# Patient Record
Sex: Male | Born: 1965 | Race: Black or African American | Hispanic: No | Marital: Married | Smoking: Never smoker
Health system: Southern US, Community
[De-identification: ages and names within clinical notes are randomized; demographics above are authoritative.]

## PROBLEM LIST (undated history)

## (undated) DIAGNOSIS — D6859 Other primary thrombophilia: Secondary | ICD-10-CM

## (undated) DIAGNOSIS — I10 Essential (primary) hypertension: Secondary | ICD-10-CM

## (undated) DIAGNOSIS — I2699 Other pulmonary embolism without acute cor pulmonale: Secondary | ICD-10-CM

## (undated) DIAGNOSIS — R760 Raised antibody titer: Secondary | ICD-10-CM

## (undated) HISTORY — DX: Raised antibody titer: R76.0

## (undated) HISTORY — DX: Essential (primary) hypertension: I10

## (undated) HISTORY — DX: Other primary thrombophilia: D68.59

## (undated) HISTORY — DX: Other pulmonary embolism without acute cor pulmonale: I26.99

---

## 2002-03-17 ENCOUNTER — Encounter: Payer: Self-pay | Admitting: Family Medicine

## 2002-03-17 ENCOUNTER — Ambulatory Visit (HOSPITAL_COMMUNITY): Admission: RE | Admit: 2002-03-17 | Discharge: 2002-03-17 | Payer: Self-pay | Admitting: Family Medicine

## 2002-03-18 ENCOUNTER — Inpatient Hospital Stay (HOSPITAL_COMMUNITY): Admission: EM | Admit: 2002-03-18 | Discharge: 2002-03-19 | Payer: Self-pay

## 2002-03-26 ENCOUNTER — Encounter: Admission: RE | Admit: 2002-03-26 | Discharge: 2002-03-26 | Payer: Self-pay | Admitting: Family Medicine

## 2004-07-26 ENCOUNTER — Ambulatory Visit: Payer: Self-pay | Admitting: Hematology & Oncology

## 2005-05-12 ENCOUNTER — Inpatient Hospital Stay (HOSPITAL_COMMUNITY): Admission: EM | Admit: 2005-05-12 | Discharge: 2005-05-16 | Payer: Self-pay | Admitting: Emergency Medicine

## 2005-05-12 ENCOUNTER — Ambulatory Visit: Payer: Self-pay | Admitting: Internal Medicine

## 2005-05-15 LAB — CONVERTED CEMR LAB
Cholesterol: 130 mg/dL
HDL: 48 mg/dL

## 2005-05-17 ENCOUNTER — Ambulatory Visit: Payer: Self-pay | Admitting: Internal Medicine

## 2005-05-21 ENCOUNTER — Ambulatory Visit: Payer: Self-pay | Admitting: Hospitalist

## 2005-05-28 ENCOUNTER — Ambulatory Visit: Payer: Self-pay | Admitting: Hospitalist

## 2005-06-11 ENCOUNTER — Ambulatory Visit: Payer: Self-pay | Admitting: Internal Medicine

## 2005-06-25 ENCOUNTER — Ambulatory Visit: Payer: Self-pay | Admitting: Internal Medicine

## 2005-07-06 ENCOUNTER — Ambulatory Visit: Payer: Self-pay | Admitting: Internal Medicine

## 2005-07-09 ENCOUNTER — Ambulatory Visit: Payer: Self-pay | Admitting: Internal Medicine

## 2005-08-02 ENCOUNTER — Ambulatory Visit: Payer: Self-pay | Admitting: Internal Medicine

## 2005-08-20 ENCOUNTER — Ambulatory Visit: Payer: Self-pay | Admitting: Internal Medicine

## 2005-09-03 ENCOUNTER — Ambulatory Visit: Payer: Self-pay | Admitting: Hospitalist

## 2005-09-27 ENCOUNTER — Ambulatory Visit: Payer: Self-pay | Admitting: Internal Medicine

## 2005-10-15 ENCOUNTER — Ambulatory Visit: Payer: Self-pay | Admitting: Internal Medicine

## 2005-11-12 ENCOUNTER — Ambulatory Visit: Payer: Self-pay | Admitting: Hospitalist

## 2005-12-10 ENCOUNTER — Ambulatory Visit: Payer: Self-pay | Admitting: Internal Medicine

## 2005-12-31 ENCOUNTER — Ambulatory Visit: Payer: Self-pay | Admitting: Hospitalist

## 2006-01-21 ENCOUNTER — Ambulatory Visit: Payer: Self-pay | Admitting: Hospitalist

## 2006-02-11 ENCOUNTER — Ambulatory Visit: Payer: Self-pay | Admitting: Internal Medicine

## 2006-03-11 ENCOUNTER — Ambulatory Visit: Payer: Self-pay | Admitting: Internal Medicine

## 2006-04-01 ENCOUNTER — Ambulatory Visit: Payer: Self-pay | Admitting: Internal Medicine

## 2006-04-03 ENCOUNTER — Ambulatory Visit: Payer: Self-pay | Admitting: Hospitalist

## 2006-04-05 DIAGNOSIS — Z86718 Personal history of other venous thrombosis and embolism: Secondary | ICD-10-CM

## 2006-04-22 ENCOUNTER — Ambulatory Visit: Payer: Self-pay | Admitting: Internal Medicine

## 2006-05-20 ENCOUNTER — Ambulatory Visit: Payer: Self-pay | Admitting: Internal Medicine

## 2006-06-17 ENCOUNTER — Ambulatory Visit: Payer: Self-pay | Admitting: Hospitalist

## 2006-07-08 ENCOUNTER — Telehealth: Payer: Self-pay | Admitting: *Deleted

## 2006-07-08 ENCOUNTER — Ambulatory Visit: Payer: Self-pay | Admitting: Internal Medicine

## 2006-08-19 ENCOUNTER — Ambulatory Visit: Payer: Self-pay | Admitting: *Deleted

## 2006-10-07 ENCOUNTER — Ambulatory Visit: Payer: Self-pay | Admitting: Internal Medicine

## 2006-10-15 ENCOUNTER — Telehealth (INDEPENDENT_AMBULATORY_CARE_PROVIDER_SITE_OTHER): Payer: Self-pay | Admitting: *Deleted

## 2006-11-11 ENCOUNTER — Ambulatory Visit: Payer: Self-pay | Admitting: Hospitalist

## 2006-11-11 LAB — CONVERTED CEMR LAB: INR: 2

## 2006-11-22 ENCOUNTER — Ambulatory Visit: Payer: Self-pay | Admitting: Hospitalist

## 2006-11-22 ENCOUNTER — Encounter (INDEPENDENT_AMBULATORY_CARE_PROVIDER_SITE_OTHER): Payer: Self-pay | Admitting: Internal Medicine

## 2006-11-22 LAB — CONVERTED CEMR LAB
Bilirubin Urine: NEGATIVE
Blood in Urine, dipstick: NEGATIVE
Glucose, Urine, Semiquant: NEGATIVE
Ketones, urine, test strip: NEGATIVE
Protein, U semiquant: NEGATIVE

## 2006-11-23 ENCOUNTER — Encounter (INDEPENDENT_AMBULATORY_CARE_PROVIDER_SITE_OTHER): Payer: Self-pay | Admitting: Internal Medicine

## 2006-11-26 DIAGNOSIS — D6859 Other primary thrombophilia: Secondary | ICD-10-CM | POA: Insufficient documentation

## 2006-11-26 LAB — CONVERTED CEMR LAB
BUN: 17 mg/dL (ref 6–23)
CO2: 25 meq/L (ref 19–32)
Chloride: 107 meq/L (ref 96–112)
GC Probe Amp, Urine: NEGATIVE
Glucose, Bld: 100 mg/dL — ABNORMAL HIGH (ref 70–99)
HCT: 43.3 % (ref 39.0–52.0)
Hemoglobin, Urine: NEGATIVE
Hemoglobin: 14.9 g/dL (ref 13.0–17.0)
Leukocytes, UA: NEGATIVE
Platelets: 205 10*3/uL (ref 150–400)
RBC: 5.25 M/uL (ref 4.22–5.81)
Specific Gravity, Urine: 1.019 (ref 1.005–1.03)
WBC: 3.4 10*3/uL — ABNORMAL LOW (ref 4.0–10.5)
pH: 7 (ref 5.0–8.0)

## 2006-12-05 ENCOUNTER — Ambulatory Visit: Payer: Self-pay | Admitting: Internal Medicine

## 2006-12-05 ENCOUNTER — Encounter (INDEPENDENT_AMBULATORY_CARE_PROVIDER_SITE_OTHER): Payer: Self-pay | Admitting: Internal Medicine

## 2006-12-16 ENCOUNTER — Ambulatory Visit: Payer: Self-pay | Admitting: Infectious Disease

## 2006-12-16 ENCOUNTER — Encounter: Payer: Self-pay | Admitting: Pharmacist

## 2006-12-16 LAB — CONVERTED CEMR LAB: INR: 6.7

## 2006-12-23 ENCOUNTER — Ambulatory Visit: Payer: Self-pay | Admitting: *Deleted

## 2006-12-23 LAB — CONVERTED CEMR LAB: INR: 3.4

## 2006-12-27 ENCOUNTER — Telehealth: Payer: Self-pay | Admitting: *Deleted

## 2007-01-27 ENCOUNTER — Ambulatory Visit: Payer: Self-pay | Admitting: Internal Medicine

## 2007-02-24 ENCOUNTER — Ambulatory Visit: Payer: Self-pay | Admitting: Hospitalist

## 2007-02-24 LAB — CONVERTED CEMR LAB: INR: 3.6

## 2007-03-31 ENCOUNTER — Ambulatory Visit: Payer: Self-pay | Admitting: Hospitalist

## 2007-03-31 LAB — CONVERTED CEMR LAB

## 2007-05-19 ENCOUNTER — Ambulatory Visit: Payer: Self-pay | Admitting: Internal Medicine

## 2007-05-19 LAB — CONVERTED CEMR LAB

## 2007-06-16 ENCOUNTER — Ambulatory Visit: Payer: Self-pay | Admitting: Hospitalist

## 2007-06-16 LAB — CONVERTED CEMR LAB: INR: 2.5

## 2007-09-15 ENCOUNTER — Ambulatory Visit: Payer: Self-pay | Admitting: *Deleted

## 2007-11-17 ENCOUNTER — Ambulatory Visit: Payer: Self-pay | Admitting: *Deleted

## 2008-01-26 ENCOUNTER — Ambulatory Visit: Payer: Self-pay | Admitting: Internal Medicine

## 2008-01-26 LAB — CONVERTED CEMR LAB

## 2008-02-23 ENCOUNTER — Ambulatory Visit: Payer: Self-pay | Admitting: Internal Medicine

## 2008-03-22 ENCOUNTER — Ambulatory Visit: Payer: Self-pay | Admitting: Infectious Diseases

## 2008-03-22 LAB — CONVERTED CEMR LAB: INR: 3.4

## 2008-04-05 ENCOUNTER — Ambulatory Visit: Payer: Self-pay | Admitting: Infectious Diseases

## 2008-04-05 ENCOUNTER — Encounter (INDEPENDENT_AMBULATORY_CARE_PROVIDER_SITE_OTHER): Payer: Self-pay | Admitting: Internal Medicine

## 2008-04-06 ENCOUNTER — Telehealth (INDEPENDENT_AMBULATORY_CARE_PROVIDER_SITE_OTHER): Payer: Self-pay | Admitting: Internal Medicine

## 2008-04-06 LAB — CONVERTED CEMR LAB
BUN: 14 mg/dL (ref 6–23)
CO2: 24 meq/L (ref 19–32)
Chloride: 106 meq/L (ref 96–112)
Cholesterol: 158 mg/dL (ref 0–200)
Creatinine, Ser: 0.95 mg/dL (ref 0.40–1.50)
Glucose, Bld: 79 mg/dL (ref 70–99)
HCT: 44.1 % (ref 39.0–52.0)
Hemoglobin: 15.4 g/dL (ref 13.0–17.0)
MCV: 82.6 fL (ref 78.0–100.0)
RBC: 5.34 M/uL (ref 4.22–5.81)
Total CHOL/HDL Ratio: 3.4
Total Protein: 7.9 g/dL (ref 6.0–8.3)
Triglycerides: 99 mg/dL (ref <150)
VLDL: 20 mg/dL (ref 0–40)

## 2008-04-19 ENCOUNTER — Ambulatory Visit: Payer: Self-pay | Admitting: Internal Medicine

## 2008-05-17 ENCOUNTER — Ambulatory Visit: Payer: Self-pay | Admitting: Internal Medicine

## 2008-05-17 LAB — CONVERTED CEMR LAB: INR: 1.9

## 2008-06-21 ENCOUNTER — Ambulatory Visit: Payer: Self-pay | Admitting: *Deleted

## 2008-06-21 LAB — CONVERTED CEMR LAB: INR: 2.8

## 2008-07-19 ENCOUNTER — Encounter (INDEPENDENT_AMBULATORY_CARE_PROVIDER_SITE_OTHER): Payer: Self-pay | Admitting: Internal Medicine

## 2008-07-19 ENCOUNTER — Ambulatory Visit: Payer: Self-pay | Admitting: Internal Medicine

## 2008-08-16 ENCOUNTER — Ambulatory Visit: Payer: Self-pay | Admitting: Internal Medicine

## 2008-08-16 LAB — CONVERTED CEMR LAB

## 2008-09-13 ENCOUNTER — Ambulatory Visit: Payer: Self-pay | Admitting: *Deleted

## 2008-09-13 LAB — CONVERTED CEMR LAB: INR: 2.4

## 2008-10-11 ENCOUNTER — Ambulatory Visit: Payer: Self-pay | Admitting: Internal Medicine

## 2008-10-11 LAB — CONVERTED CEMR LAB: INR: 2.4

## 2008-12-13 ENCOUNTER — Ambulatory Visit: Payer: Self-pay | Admitting: Internal Medicine

## 2008-12-13 LAB — CONVERTED CEMR LAB: INR: 2.3

## 2009-01-17 ENCOUNTER — Ambulatory Visit: Payer: Self-pay | Admitting: Infectious Diseases

## 2009-01-17 LAB — CONVERTED CEMR LAB: INR: 1.8

## 2009-02-14 ENCOUNTER — Ambulatory Visit: Payer: Self-pay | Admitting: Internal Medicine

## 2009-02-14 LAB — CONVERTED CEMR LAB

## 2009-03-14 ENCOUNTER — Ambulatory Visit: Payer: Self-pay | Admitting: Internal Medicine

## 2009-03-16 ENCOUNTER — Ambulatory Visit: Payer: Self-pay | Admitting: Internal Medicine

## 2009-04-18 ENCOUNTER — Ambulatory Visit: Payer: Self-pay | Admitting: Internal Medicine

## 2009-04-18 LAB — CONVERTED CEMR LAB: INR: 3.8

## 2009-05-16 ENCOUNTER — Ambulatory Visit: Payer: Self-pay | Admitting: Internal Medicine

## 2009-05-16 LAB — CONVERTED CEMR LAB

## 2009-06-13 ENCOUNTER — Ambulatory Visit: Payer: Self-pay | Admitting: Internal Medicine

## 2009-06-13 LAB — CONVERTED CEMR LAB: INR: 2.2

## 2009-07-11 ENCOUNTER — Ambulatory Visit: Payer: Self-pay | Admitting: Internal Medicine

## 2009-07-11 LAB — CONVERTED CEMR LAB: INR: 1.9

## 2009-08-01 ENCOUNTER — Ambulatory Visit: Payer: Self-pay | Admitting: Infectious Diseases

## 2009-08-29 ENCOUNTER — Ambulatory Visit: Payer: Self-pay | Admitting: Internal Medicine

## 2009-09-26 ENCOUNTER — Ambulatory Visit: Payer: Self-pay | Admitting: Internal Medicine

## 2009-10-24 ENCOUNTER — Ambulatory Visit: Payer: Self-pay | Admitting: Internal Medicine

## 2009-10-24 LAB — CONVERTED CEMR LAB: INR: 2.8

## 2010-01-23 ENCOUNTER — Ambulatory Visit: Payer: Self-pay | Admitting: Internal Medicine

## 2010-02-20 ENCOUNTER — Ambulatory Visit: Payer: Self-pay | Admitting: Internal Medicine

## 2010-02-20 LAB — CONVERTED CEMR LAB: INR: 2.7

## 2010-03-20 ENCOUNTER — Ambulatory Visit: Payer: Self-pay | Admitting: Internal Medicine

## 2010-03-20 LAB — CONVERTED CEMR LAB

## 2010-04-03 ENCOUNTER — Ambulatory Visit: Payer: Self-pay | Admitting: Internal Medicine

## 2010-04-24 ENCOUNTER — Ambulatory Visit: Payer: Self-pay | Admitting: Internal Medicine

## 2010-04-24 LAB — CONVERTED CEMR LAB: INR: 2.8

## 2010-05-23 DIAGNOSIS — Z86711 Personal history of pulmonary embolism: Secondary | ICD-10-CM

## 2010-05-23 DIAGNOSIS — D6859 Other primary thrombophilia: Secondary | ICD-10-CM

## 2010-05-23 DIAGNOSIS — Z7901 Long term (current) use of anticoagulants: Secondary | ICD-10-CM

## 2010-05-29 ENCOUNTER — Ambulatory Visit: Admission: RE | Admit: 2010-05-29 | Discharge: 2010-05-29 | Payer: Self-pay | Source: Home / Self Care

## 2010-06-06 NOTE — Assessment & Plan Note (Signed)
Summary: 261/cfb  Anticoagulant Therapy Managed by: Barbera Setters. Janie Morning  PharmD [name] PCP: Nilda Riggs MD [facility] Attending: Darl Pikes, Beth Indication 1: Pulmonary  embolus Indication 2: Aftercare long term use Anticoagulants V58.61,V58.83 Start date: 05/12/2005 Duration: Indefinite  Patient Assessment Reviewed by: Chancy Milroy PharmD  January 23, 2010 Medication review: verified warfarin dosage & schedule,verified previous prescription medications, verified doses & any changes, verified new medications, reviewed OTC medications, reviewed OTC health products-vitamins supplements etc Complications: none Dietary changes: none   Health status changes: none   Lifestyle changes: none   Recent/future hospitalizations: none   Recent/future procedures: none   Recent/future dental: none Patient Assessment Part 2:  Have you MISSED ANY DOSES or CHANGED TABLETS?  No missed Warfarin doses or changed tablets.  Have you had any BRUISING or BLEEDING ( nose or gum bleeds,blood in urine or stool)?  No reported bruising or bleeding in nose, gums, urine, stool.  Have you STARTED or STOPPED any MEDICATIONS, including OTC meds,herbals or supplements?  No other medications or herbal supplements were started or stopped.  Have you CHANGED your DIET, especially green vegetables,or ALCOHOL intake?  No changes in diet or alcohol intake.  Have you had any ILLNESSES or HOSPITALIZATIONS?  No reported illnesses or hospitalizations  Have you had any signs of CLOTTING?(chest discomfort,dizziness,shortness of breath,arms tingling,slurred speech,swelling or redness in leg)    No chest discomfort, dizziness, shortness of breath, tingling in arm, slurred speech, swelling, or redness in leg.     Treatment  Target INR: 2.0-3.0 INR: 3.1  Date: 01/23/2010 Regimen In:  37.5mg /week INR reflects regimen in: 3.1  New  Tablet strength: : 5mg  Regimen Out:     Sunday: 1 Tablet     Monday: 1 Tablet     Tuesday: 1  Tablet     Wednesday: 1 Tablet     Thursday: 1 Tablet      Friday: 1 Tablet     Saturday: 1 Tablet Total Weekly: 35.0mg /week mg  Next INR Due: 02/20/2010 Adjusted by: Barbera Setters. Alexandria Lodge [name] PharmD [name]   Return to anticoagulation clinic:  02/20/2010 Time of next visit: 1615    Allergies: No Known Drug Allergies

## 2010-06-06 NOTE — Assessment & Plan Note (Signed)
Summary: COU/VS  Anticoagulant Therapy Managed by: Barbera Setters. Janie Morning  PharmD [name] PCP: Nilda Riggs MD [facility] Attending: Rogelia Boga MD, Lanora Manis Indication 1: Pulmonary  embolus Indication 2: Aftercare long term use Anticoagulants V58.61,V58.83 Start date: 05/12/2005 Duration: Indefinite  Patient Assessment Reviewed by: Chancy Milroy PharmD  August 01, 2009 Medication review: verified warfarin dosage & schedule,verified previous prescription medications, verified doses & any changes, verified new medications, reviewed OTC medications, reviewed OTC health products-vitamins supplements etc Complications: none Dietary changes: none   Health status changes: none   Lifestyle changes: none   Recent/future hospitalizations: none   Recent/future procedures: none   Recent/future dental: none Patient Assessment Part 2:  Have you MISSED ANY DOSES or CHANGED TABLETS?  No missed Warfarin doses or changed tablets.  Have you had any BRUISING or BLEEDING ( nose or gum bleeds,blood in urine or stool)?  No reported bruising or bleeding in nose, gums, urine, stool.  Have you STARTED or STOPPED any MEDICATIONS, including OTC meds,herbals or supplements?  No other medications or herbal supplements were started or stopped.  Have you CHANGED your DIET, especially green vegetables,or ALCOHOL intake?  No changes in diet or alcohol intake.  Have you had any ILLNESSES or HOSPITALIZATIONS?  No reported illnesses or hospitalizations  Have you had any signs of CLOTTING?(chest discomfort,dizziness,shortness of breath,arms tingling,slurred speech,swelling or redness in leg)    No chest discomfort, dizziness, shortness of breath, tingling in arm, slurred speech, swelling, or redness in leg.     Treatment  Target INR: 2.0-3.0 INR: 2.9  Date: 08/01/2009 Regimen In:  42.5mg /week INR reflects regimen in: 2.9  New  Tablet strength: : 5mg  Regimen Out:     Sunday: 1 Tablet     Monday: 1 & 1/2 Tablet  Tuesday: 1 Tablet     Wednesday: 1 & 1/2 Tablet     Thursday: 1 Tablet      Friday: 1 & 1/2 Tablet     Saturday: 1 Tablet Total Weekly: 42.5mg /week mg  Next INR Due: 08/29/2009 Adjusted by: Barbera Setters. Alexandria Lodge [name] PharmD [name]   Return to anticoagulation clinic:  08/29/2009 Time of next visit: 1615    Allergies: No Known Drug Allergies

## 2010-06-06 NOTE — Assessment & Plan Note (Signed)
Summary: COU/CH  Anticoagulant Therapy Managed by: Barbera Setters. Ronnie Fowler  PharmD [name] PCP: Nilda Riggs MD [facility] Attending: Margarito Liner MD Indication 1: Pulmonary  embolus Indication 2: Aftercare long term use Anticoagulants V58.61,V58.83 Start date: 05/12/2005 Duration: Indefinite  Patient Assessment Reviewed by: Chancy Milroy PharmD  June 13, 2009 Medication review: verified warfarin dosage & schedule,verified previous prescription medications, verified doses & any changes, verified new medications, reviewed OTC medications, reviewed OTC health products-vitamins supplements etc Complications: none Dietary changes: none   Health status changes: none   Lifestyle changes: none   Recent/future hospitalizations: none   Recent/future procedures: none   Recent/future dental: none Patient Assessment Part 2:  Have you MISSED ANY DOSES or CHANGED TABLETS?  No missed Warfarin doses or changed tablets.  Have you had any BRUISING or BLEEDING ( nose or gum bleeds,blood in urine or stool)?  No reported bruising or bleeding in nose, gums, urine, stool.  Have you STARTED or STOPPED any MEDICATIONS, including OTC meds,herbals or supplements?  No other medications or herbal supplements were started or stopped.  Have you CHANGED your DIET, especially green vegetables,or ALCOHOL intake?  No changes in diet or alcohol intake.  Have you had any ILLNESSES or HOSPITALIZATIONS?  No reported illnesses or hospitalizations  Have you had any signs of CLOTTING?(chest discomfort,dizziness,shortness of breath,arms tingling,slurred speech,swelling or redness in leg)    No chest discomfort, dizziness, shortness of breath, tingling in arm, slurred speech, swelling, or redness in leg.     Treatment  Target INR: 2.0-3.0 INR: 2.2  Date: 06/13/2009 Regimen In:  35.0mg /week INR reflects regimen in: 2.2  New  Tablet strength: : 5mg  Regimen Out:     Sunday: 1 Tablet     Monday: 1 Tablet     Tuesday: 1  Tablet     Wednesday: 1 & 1/2 Tablet     Thursday: 1 Tablet      Friday: 1 Tablet     Saturday: 1 Tablet Total Weekly: 37.5mg /week mg  Next INR Due: 07/11/2009 Adjusted by: Barbera Setters. Alexandria Lodge [name] PharmD [name]   Return to anticoagulation clinic:  07/11/2009 Time of next visit: 1615    Allergies: No Known Drug Allergies

## 2010-06-06 NOTE — Assessment & Plan Note (Signed)
Summary: COU/VS  Anticoagulant Therapy Managed by: Barbera Setters. Janie Morning  PharmD [name] PCP: Nilda Riggs MD [facility] Attending: Josem Kaufmann MD, Lawrence Indication 1: Pulmonary  embolus Indication 2: Aftercare long term use Anticoagulants V58.61,V58.83 Start date: 05/12/2005 Duration: Indefinite  Patient Assessment Reviewed by: Chancy Milroy PharmD  August 29, 2009 Medication review: verified warfarin dosage & schedule,verified previous prescription medications, verified doses & any changes, verified new medications, reviewed OTC medications, reviewed OTC health products-vitamins supplements etc Complications: none Dietary changes: none   Health status changes: none   Lifestyle changes: none   Recent/future hospitalizations: none   Recent/future procedures: none   Recent/future dental: none Patient Assessment Part 2:  Have you MISSED ANY DOSES or CHANGED TABLETS?  No missed Warfarin doses or changed tablets.  Have you had any BRUISING or BLEEDING ( nose or gum bleeds,blood in urine or stool)?  No reported bruising or bleeding in nose, gums, urine, stool.  Have you STARTED or STOPPED any MEDICATIONS, including OTC meds,herbals or supplements?  No other medications or herbal supplements were started or stopped.  Have you CHANGED your DIET, especially green vegetables,or ALCOHOL intake?  No changes in diet or alcohol intake.  Have you had any ILLNESSES or HOSPITALIZATIONS?  No reported illnesses or hospitalizations  Have you had any signs of CLOTTING?(chest discomfort,dizziness,shortness of breath,arms tingling,slurred speech,swelling or redness in leg)    No chest discomfort, dizziness, shortness of breath, tingling in arm, slurred speech, swelling, or redness in leg.     Treatment  Target INR: 2.0-3.0 INR: 4.2  Date: 08/29/2009 Regimen In:  42.5mg /week INR reflects regimen in: 4.2  New  Tablet strength: : 5mg  Regimen Out:     Sunday: 1 Tablet     Monday: 1 Tablet     Tuesday: 1  Tablet     Wednesday: 1 & 1/2 Tablet     Thursday: 1 Tablet      Friday: 1 Tablet     Saturday: 1 Tablet Total Weekly: 37.5mg/week mg  Next INR Due: 09/26/2009 Adjusted by: Brynna Dobos B. Colin Ellers [name] PharmD [name]   Return to anticoagulation clinic:  09/26/2009 Time of next visit: 1615    Allergies: No Known Drug Allergies Prescriptions: WARFARIN SODIUM 5 MG TABS (WARFARIN SODIUM) Tablet Strength: 5mg Take as directed  #90 x 2   Entered by:   Jay Wilna Pennie PharmD   Authorized by:   Lawrence Klima MD   Signed by:   Jay Aastha Dayley PharmD on 08/29/2009   Method used:   Electronically to        [facility]  [id number] (retail)       [address]       [place], [place]  [address]       Ph: [phone] or [phone]       Fax: [phone]   RxID:   [id number]

## 2010-06-06 NOTE — Assessment & Plan Note (Signed)
Summary: COU/VS  Anticoagulant Therapy Managed by: Barbera Setters. Janie Morning  PharmD [name] PCP: Nilda Riggs MD [facility] Attending: Margarito Liner MD Indication 1: Pulmonary  embolus Indication 2: Aftercare long term use Anticoagulants V58.61,V58.83 Start date: 05/12/2005 Duration: Indefinite  Patient Assessment Reviewed by: Chancy Milroy PharmD  July 11, 2009 Medication review: verified warfarin dosage & schedule,verified previous prescription medications, verified doses & any changes, verified new medications, reviewed OTC medications, reviewed OTC health products-vitamins supplements etc Complications: none Dietary changes: none   Health status changes: none   Lifestyle changes: none   Recent/future hospitalizations: none   Recent/future procedures: none   Recent/future dental: none Patient Assessment Part 2:  Have you MISSED ANY DOSES or CHANGED TABLETS?  No missed Warfarin doses or changed tablets.  Have you had any BRUISING or BLEEDING ( nose or gum bleeds,blood in urine or stool)?  No reported bruising or bleeding in nose, gums, urine, stool.  Have you STARTED or STOPPED any MEDICATIONS, including OTC meds,herbals or supplements?  No other medications or herbal supplements were started or stopped.  Have you CHANGED your DIET, especially green vegetables,or ALCOHOL intake?  No changes in diet or alcohol intake.  Have you had any ILLNESSES or HOSPITALIZATIONS?  No reported illnesses or hospitalizations  Have you had any signs of CLOTTING?(chest discomfort,dizziness,shortness of breath,arms tingling,slurred speech,swelling or redness in leg)    No chest discomfort, dizziness, shortness of breath, tingling in arm, slurred speech, swelling, or redness in leg.     Treatment  Target INR: 2.0-3.0 INR: 1.9  Date: 07/11/2009 Regimen In:  37.5mg /week INR reflects regimen in: 1.9  New  Tablet strength: : 5mg  Regimen Out:     Sunday: 1 Tablet     Monday: 1 & 1/2 Tablet     Tuesday: 1  Tablet     Wednesday: 1 & 1/2 Tablet     Thursday: 1 Tablet      Friday: 1 & 1/2 Tablet     Saturday: 1 Tablet Total Weekly: 42.5mg /week mg  Next INR Due: 08/01/2009 Adjusted by: Barbera Setters. Alexandria Lodge [name] PharmD [name]   Return to anticoagulation clinic:  08/01/2009 Time of next visit: 1615    Allergies: No Known Drug Allergies

## 2010-06-06 NOTE — Assessment & Plan Note (Signed)
Summary: COU/CH  Anticoagulant Therapy Managed by: Barbera Setters. Janie Morning  PharmD [name] PCP: Nilda Riggs MD [facility] Attending: Rogelia Boga MD, Lanora Manis Indication 1: Pulmonary  embolus Indication 2: Aftercare long term use Anticoagulants V58.61,V58.83 Start date: 05/12/2005 Duration: Indefinite  Patient Assessment Reviewed by: Chancy Milroy PharmD  February 20, 2010 Medication review: verified warfarin dosage & schedule,verified previous prescription medications, verified doses & any changes, verified new medications, reviewed OTC medications, reviewed OTC health products-vitamins supplements etc Complications: none Dietary changes: none   Health status changes: none   Lifestyle changes: none   Recent/future hospitalizations: none   Recent/future procedures: none   Recent/future dental: none Patient Assessment Part 2:  Have you MISSED ANY DOSES or CHANGED TABLETS?  No missed Warfarin doses or changed tablets.  Have you had any BRUISING or BLEEDING ( nose or gum bleeds,blood in urine or stool)?  No reported bruising or bleeding in nose, gums, urine, stool.  Have you STARTED or STOPPED any MEDICATIONS, including OTC meds,herbals or supplements?  No other medications or herbal supplements were started or stopped.  Have you CHANGED your DIET, especially green vegetables,or ALCOHOL intake?  No changes in diet or alcohol intake.  Have you had any ILLNESSES or HOSPITALIZATIONS?  No reported illnesses or hospitalizations  Have you had any signs of CLOTTING?(chest discomfort,dizziness,shortness of breath,arms tingling,slurred speech,swelling or redness in leg)    No chest discomfort, dizziness, shortness of breath, tingling in arm, slurred speech, swelling, or redness in leg.     Treatment  Target INR: 2.0-3.0 INR: 2.7  Date: 02/20/2010 Regimen In:  35.0mg /week INR reflects regimen in: 2.7  New  Tablet strength: : 5mg  Regimen Out:     Sunday: 1 Tablet     Monday: 1 Tablet     Tuesday:  1 Tablet     Wednesday: 1 Tablet     Thursday: 1 Tablet      Friday: 1 Tablet     Saturday: 1 Tablet Total Weekly: 35.0mg /week mg  Next INR Due: 03/20/2010 Adjusted by: Barbera Setters. Alexandria Lodge [name] PharmD [name]   Return to anticoagulation clinic:  03/20/2010 Time of next visit: 1630    Allergies: No Known Drug Allergies

## 2010-06-06 NOTE — Assessment & Plan Note (Signed)
Summary: COU/CH  Anticoagulant Therapy Managed by: Barbera Setters. Janie Morning  PharmD [name] PCP: Nilda Riggs MD [facility] Attending: Josem Kaufmann MD, Lawrence Indication 1: Pulmonary  embolus Indication 2: Aftercare long term use Anticoagulants V58.61,V58.83 Start date: 05/12/2005 Duration: Indefinite  Patient Assessment Reviewed by: Chancy Milroy PharmD  May 17, 2009 Medication review: verified warfarin dosage & schedule,verified previous prescription medications, verified doses & any changes, verified new medications, reviewed OTC medications, reviewed OTC health products-vitamins supplements etc Complications: none Dietary changes: none   Health status changes: none   Lifestyle changes: none   Recent/future hospitalizations: none   Recent/future procedures: none   Recent/future dental: none Patient Assessment Part 2:  Have you MISSED ANY DOSES or CHANGED TABLETS?  No missed Warfarin doses or changed tablets.  Have you had any BRUISING or BLEEDING ( nose or gum bleeds,blood in urine or stool)?  No reported bruising or bleeding in nose, gums, urine, stool.  Have you STARTED or STOPPED any MEDICATIONS, including OTC meds,herbals or supplements?  No other medications or herbal supplements were started or stopped.  Have you CHANGED your DIET, especially green vegetables,or ALCOHOL intake?  No changes in diet or alcohol intake.  Have you had any ILLNESSES or HOSPITALIZATIONS?  No reported illnesses or hospitalizations  Have you had any signs of CLOTTING?(chest discomfort,dizziness,shortness of breath,arms tingling,slurred speech,swelling or redness in leg)    No chest discomfort, dizziness, shortness of breath, tingling in arm, slurred speech, swelling, or redness in leg.     Treatment  Target INR: 2.0-3.0 INR: 2.6  Date: 05/16/2009 Regimen In:  35.0mg /week INR reflects regimen in: 2.6  New  Tablet strength: : 5mg  Regimen Out:     Sunday: 1 Tablet     Monday: 1 Tablet     Tuesday: 1  Tablet     Wednesday: 1 Tablet     Thursday: 1 Tablet      Friday: 1 Tablet     Saturday: 1 Tablet Total Weekly: 35.0mg /week mg  Next INR Due: 06/13/2009 Adjusted by: Barbera Setters. Alexandria Lodge [name] PharmD [name]   Return to anticoagulation clinic:  06/13/2009 Time of next visit: 1615    Allergies: No Known Drug Allergies  Laboratory Results   Blood Tests   Date/Time Received: May 16, 2009 4:54 PM Date/Time Reported: Alric Quan  May 16, 2009 4:54 PM    INR: 2.6   (Normal Range: 0.88-1.12   Therap INR: 2.0-3.5) Comments: patient cell # [phone].   Results reported to Dr Alexandria Lodge 05-16-09 1655p by phone  Alric Quan  May 16, 2009 4:56 PM

## 2010-06-06 NOTE — Assessment & Plan Note (Signed)
Summary: COU/CH  Anticoagulant Therapy Managed by: Barbera Setters. Janie Morning  PharmD [name] PCP: Nilda Riggs MD [facility] Attending: Rogelia Boga MD, Lanora Manis Indication 1: Pulmonary  embolus Indication 2: Aftercare long term use Anticoagulants V58.61,V58.83 Start date: 05/12/2005 Duration: Indefinite  Patient Assessment Reviewed by: Chancy Milroy PharmD  October 24, 2009 Medication review: verified warfarin dosage & schedule,verified previous prescription medications, verified doses & any changes, verified new medications, reviewed OTC medications, reviewed OTC health products-vitamins supplements etc Complications: none Dietary changes: none   Health status changes: none   Lifestyle changes: none   Recent/future hospitalizations: none   Recent/future procedures: none   Recent/future dental: none Patient Assessment Part 2:  Have you MISSED ANY DOSES or CHANGED TABLETS?  No missed Warfarin doses or changed tablets.  Have you had any BRUISING or BLEEDING ( nose or gum bleeds,blood in urine or stool)?  No reported bruising or bleeding in nose, gums, urine, stool.  Have you STARTED or STOPPED any MEDICATIONS, including OTC meds,herbals or supplements?  No other medications or herbal supplements were started or stopped.  Have you CHANGED your DIET, especially green vegetables,or ALCOHOL intake?  No changes in diet or alcohol intake.  Have you had any ILLNESSES or HOSPITALIZATIONS?  No reported illnesses or hospitalizations  Have you had any signs of CLOTTING?(chest discomfort,dizziness,shortness of breath,arms tingling,slurred speech,swelling or redness in leg)    No chest discomfort, dizziness, shortness of breath, tingling in arm, slurred speech, swelling, or redness in leg.     Treatment  Target INR: 2.0-3.0 INR: 2.8  Date: 10/24/2009 Regimen In:  37.5mg /week INR reflects regimen in: 2.8  New  Tablet strength: : 5mg  Regimen Out:     Sunday: 1 Tablet     Monday: 1 Tablet     Tuesday: 1  Tablet     Wednesday: 1 & 1/2 Tablet     Thursday: 1 Tablet      Friday: 1 Tablet     Saturday: 1 Tablet Total Weekly: 37.5mg /week mg  Next INR Due: 11/21/2009 Adjusted by: Barbera Setters. Alexandria Lodge [name] PharmD [name]   Return to anticoagulation clinic:  11/21/2009 Time of next visit: 1615    Allergies: No Known Drug Allergies

## 2010-06-06 NOTE — Assessment & Plan Note (Signed)
Summary: COU/CH  Anticoagulant Therapy Managed by: Barbera Setters. Janie Morning  PharmD [name] PCP: Nilda Riggs MD [facility] Attending: Lowella Bandy MD Indication 1: Pulmonary  embolus Indication 2: Aftercare long term use Anticoagulants V58.61,V58.83 Start date: 05/12/2005 Duration: Indefinite  Patient Assessment Reviewed by: Chancy Milroy PharmD  March 20, 2010 Medication review: verified warfarin dosage & schedule,verified previous prescription medications, verified doses & any changes, verified new medications, reviewed OTC medications, reviewed OTC health products-vitamins supplements etc Complications: none Dietary changes: none   Health status changes: none   Lifestyle changes: none   Recent/future hospitalizations: none   Recent/future procedures: none   Recent/future dental: none Patient Assessment Part 2:  Have you MISSED ANY DOSES or CHANGED TABLETS?  No missed Warfarin doses or changed tablets.  Have you had any BRUISING or BLEEDING ( nose or gum bleeds,blood in urine or stool)?  No reported bruising or bleeding in nose, gums, urine, stool.  Have you STARTED or STOPPED any MEDICATIONS, including OTC meds,herbals or supplements?  No other medications or herbal supplements were started or stopped.  Have you CHANGED your DIET, especially green vegetables,or ALCOHOL intake?  No changes in diet or alcohol intake.  Have you had any ILLNESSES or HOSPITALIZATIONS?  No reported illnesses or hospitalizations  Have you had any signs of CLOTTING?(chest discomfort,dizziness,shortness of breath,arms tingling,slurred speech,swelling or redness in leg)    No chest discomfort, dizziness, shortness of breath, tingling in arm, slurred speech, swelling, or redness in leg.     Treatment  Target INR: 2.0-3.0 INR: 1.8  Date: 03/20/2010 Regimen In:  35.0mg /week INR reflects regimen in: 1.8  New  Tablet strength: : 5mg  Regimen Out:     Sunday: 1 Tablet     Monday: 1 & 1/2 Tablet  Tuesday: 1 Tablet     Wednesday: 1 Tablet     Thursday: 1 Tablet      Friday: 1 Tablet     Saturday: 1 Tablet Total Weekly: 37.5mg /week mg  Next INR Due: 04/03/2010 Adjusted by: Barbera Setters. Alexandria Lodge [name] PharmD [name]   Return to anticoagulation clinic:  04/03/2010 Time of next visit: 1615    Allergies: No Known Drug Allergies

## 2010-06-06 NOTE — Assessment & Plan Note (Signed)
Summary: COU/SB.  Anticoagulant Therapy Managed by: Ronnie Fowler. Ronnie Fowler  PharmD [name] PCP: Ronnie Riggs MD [facility] Attending: Darl Pikes, Beth Indication 1: Pulmonary  embolus Indication 2: Aftercare long term use Anticoagulants V58.61,V58.83 Start date: 05/12/2005 Duration: Indefinite  Patient Assessment Reviewed by: Ronnie Fowler PharmD  Sep 26, 2009 Medication review: verified warfarin dosage & schedule,verified previous prescription medications, verified doses & any changes, verified new medications, reviewed OTC medications, reviewed OTC health products-vitamins supplements etc Complications: none Dietary changes: none   Health status changes: none   Lifestyle changes: none   Recent/future hospitalizations: none   Recent/future procedures: none   Recent/future dental: none Patient Assessment Part 2:  Have you MISSED ANY DOSES or CHANGED TABLETS?  No missed Warfarin doses or changed tablets.  Have you had any BRUISING or BLEEDING ( nose or gum bleeds,blood in urine or stool)?  No reported bruising or bleeding in nose, gums, urine, stool.  Have you STARTED or STOPPED any MEDICATIONS, including OTC meds,herbals or supplements?  No other medications or herbal supplements were started or stopped.  Have you CHANGED your DIET, especially green vegetables,or ALCOHOL intake?  No changes in diet or alcohol intake.  Have you had any ILLNESSES or HOSPITALIZATIONS?  No reported illnesses or hospitalizations  Have you had any signs of CLOTTING?(chest discomfort,dizziness,shortness of breath,arms tingling,slurred speech,swelling or redness in leg)    No chest discomfort, dizziness, shortness of breath, tingling in arm, slurred speech, swelling, or redness in leg.     Treatment  Target INR: 2.0-3.0 INR: 2.8  Date: 09/26/2009 Regimen In:  37.5mg /week INR reflects regimen in: 2.8  New  Tablet strength: : 5mg  Regimen Out:     Sunday: 1 Tablet     Monday: 1 Tablet     Tuesday: 1  Tablet     Wednesday: 1 & 1/2 Tablet     Thursday: 1 Tablet      Friday: 1 Tablet     Saturday: 1 Tablet Total Weekly: 37.5mg /week mg  Next INR Due: 10/24/2009 Adjusted by: Ronnie Fowler. Ronnie Fowler PharmD [name]   Return to anticoagulation clinic:  10/24/2009 Time of next visit: 1630    Allergies: No Known Drug Allergies

## 2010-06-06 NOTE — Assessment & Plan Note (Signed)
Summary: COU/CH  Anticoagulant Therapy Managed by: Barbera Setters. Ronnie Fowler  PharmD [name] PCP: Nilda Riggs MD [facility] Attending: Darl Pikes, Beth Indication 1: Pulmonary  embolus Indication 2: Aftercare long term use Anticoagulants V58.61,V58.83 Start date: 05/12/2005 Duration: Indefinite  Patient Assessment Reviewed by: Chancy Milroy PharmD  April 03, 2010 Medication review: verified warfarin dosage & schedule,verified previous prescription medications, verified doses & any changes, verified new medications, reviewed OTC medications, reviewed OTC health products-vitamins supplements etc Complications: none Dietary changes: none   Health status changes: none   Lifestyle changes: none   Recent/future hospitalizations: none   Recent/future procedures: none   Recent/future dental: none Patient Assessment Part 2:  Have you MISSED ANY DOSES or CHANGED TABLETS?  No missed Warfarin doses or changed tablets.  Have you had any BRUISING or BLEEDING ( nose or gum bleeds,blood in urine or stool)?  No reported bruising or bleeding in nose, gums, urine, stool.  Have you STARTED or STOPPED any MEDICATIONS, including OTC meds,herbals or supplements?  No other medications or herbal supplements were started or stopped.  Have you CHANGED your DIET, especially green vegetables,or ALCOHOL intake?  No changes in diet or alcohol intake.  Have you had any ILLNESSES or HOSPITALIZATIONS?  No reported illnesses or hospitalizations  Have you had any signs of CLOTTING?(chest discomfort,dizziness,shortness of breath,arms tingling,slurred speech,swelling or redness in leg)    No chest discomfort, dizziness, shortness of breath, tingling in arm, slurred speech, swelling, or redness in leg.     Treatment  Target INR: 2.0-3.0 INR: 3.7  Date: 04/03/2010 Regimen In:  37.5mg /week INR reflects regimen in: 3.7  New  Tablet strength: : 5mg  Regimen Out:     Sunday: 1 Tablet     Monday: 1 Tablet     Tuesday: 1  Tablet     Wednesday: 1 Tablet     Thursday: 1 Tablet      Friday: 1 Tablet     Saturday: 1 Tablet Total Weekly: 35.0mg /week mg  Next INR Due: 04/24/2010 Adjusted by: Barbera Setters. Alexandria Lodge [name] PharmD [name]   Return to anticoagulation clinic:  04/24/2010 Time of next visit: 1630    Allergies: No Known Drug Allergies

## 2010-06-08 NOTE — Assessment & Plan Note (Addendum)
Summary: COU/CH  Anticoagulant Therapy Managed by: Barbera Setters. Ronnie Fowler  PharmD [name] PCP: Nilda Riggs MD [facility] Attending: Darl Pikes, Beth Indication 1: Pulmonary  embolus Indication 2: Aftercare long term use Anticoagulants V58.61,V58.83 Start date: 05/12/2005 Duration: Indefinite  Patient Assessment Reviewed by: Chancy Milroy PharmD  May 29, 2010 Medication review: verified warfarin dosage & schedule,verified previous prescription medications, verified doses & any changes, verified new medications, reviewed OTC medications, reviewed OTC health products-vitamins supplements etc Complications: none Dietary changes: none   Health status changes: none   Lifestyle changes: none   Recent/future hospitalizations: none   Recent/future procedures: none   Recent/future dental: none Patient Assessment Part 2:  Have you MISSED ANY DOSES or CHANGED TABLETS?  No missed Warfarin doses or changed tablets.  Have you had any BRUISING or BLEEDING ( nose or gum bleeds,blood in urine or stool)?  No reported bruising or bleeding in nose, gums, urine, stool.  Have you STARTED or STOPPED any MEDICATIONS, including OTC meds,herbals or supplements?  No other medications or herbal supplements were started or stopped.  Have you CHANGED your DIET, especially green vegetables,or ALCOHOL intake?  No changes in diet or alcohol intake.  Have you had any ILLNESSES or HOSPITALIZATIONS?  No reported illnesses or hospitalizations  Have you had any signs of CLOTTING?(chest discomfort,dizziness,shortness of breath,arms tingling,slurred speech,swelling or redness in leg)    No chest discomfort, dizziness, shortness of breath, tingling in arm, slurred speech, swelling, or redness in leg.     Treatment  Target INR: 2.0-3.0 INR: 2.7  Date: 05/29/2010 Regimen In:  35.0mg /week INR reflects regimen in: 2.7  New  Tablet strength: : 5mg  Regimen Out:     Sunday: 1 Tablet     Monday: 1 Tablet     Tuesday: 1  Tablet     Wednesday: 1 Tablet     Thursday: 1 Tablet      Friday: 1 Tablet     Saturday: 1 Tablet Total Weekly: 35.0mg/week mg  Next INR Due: 06/26/2010 Adjusted by: Adreena Willits B. Malessa Zartman [name] PharmD [name]   Return to anticoagulation clinic:  06/26/2010 Time of next visit: 1615    Allergies: No Known Drug Allergies Prescriptions: WARFARIN SODIUM 5 MG TABS (WARFARIN SODIUM) Tablet Strength: 5mg Take as directed  #90 x 2   Entered by:   Jay Jentry Mcqueary PharmD   Authorized by:   Beth Golding  DO   Signed by:   Jay Zia Najera PharmD on 05/29/2010   Method used:   Electronically to        [facility]  [id number] (retail)       [address]       [place]       [place], [place]  [address]       Ph: [phone] or [phone]       Fax: [phone]   RxID:   [id number] WARFARIN SODIUM 5 MG TABS (WARFARIN SODIUM) Tablet Strength: 5mg Take as directed  #90 x 2   Entered by:   Jay Kennedy Bohanon PharmD   Authorized by:   Beth Golding  DO   Signed by:   Jay Shakeela Rabadan PharmD on 05/29/2010   Method used:   Electronically to        [facility]  [address]  [id number] (retail)       [address] [place], [place]  [address]       Ph:  [phone] or [phone]       Fax: [phone]   RxID:   [id number]

## 2010-06-08 NOTE — Assessment & Plan Note (Addendum)
Summary: COU/CH  Anticoagulant Therapy Managed by: Barbera Setters. Janie Morning  PharmD [name] PCP: Nilda Riggs MD [facility] Attending: Margarito Liner MD Indication 1: Pulmonary  embolus Indication 2: Aftercare long term use Anticoagulants V58.61,V58.83 Start date: 05/12/2005 Duration: Indefinite  Patient Assessment Reviewed by: Chancy Milroy PharmD  April 24, 2010 Medication review: verified warfarin dosage & schedule,verified previous prescription medications, verified doses & any changes, verified new medications, reviewed OTC medications, reviewed OTC health products-vitamins supplements etc Complications: none Dietary changes: none   Health status changes: none   Lifestyle changes: none   Recent/future hospitalizations: none   Recent/future procedures: none   Recent/future dental: none Patient Assessment Part 2:  Have you MISSED ANY DOSES or CHANGED TABLETS?  No missed Warfarin doses or changed tablets.  Have you had any BRUISING or BLEEDING ( nose or gum bleeds,blood in urine or stool)?  No reported bruising or bleeding in nose, gums, urine, stool.  Have you STARTED or STOPPED any MEDICATIONS, including OTC meds,herbals or supplements?  No other medications or herbal supplements were started or stopped.  Have you CHANGED your DIET, especially green vegetables,or ALCOHOL intake?  No changes in diet or alcohol intake.  Have you had any ILLNESSES or HOSPITALIZATIONS?  No reported illnesses or hospitalizations  Have you had any signs of CLOTTING?(chest discomfort,dizziness,shortness of breath,arms tingling,slurred speech,swelling or redness in leg)    No chest discomfort, dizziness, shortness of breath, tingling in arm, slurred speech, swelling, or redness in leg.     Treatment  Target INR: 2.0-3.0 INR: 2.8  Date: 04/24/2010 Regimen In:  35.0mg /week INR reflects regimen in: 2.8  New  Tablet strength: : 5mg  Regimen Out:     Sunday: 1 Tablet     Monday: 1 Tablet     Tuesday: 1  Tablet     Wednesday: 1 Tablet     Thursday: 1 Tablet      Friday: 1 Tablet     Saturday: 1 Tablet Total Weekly: 35.0mg /week mg  Next INR Due: 05/29/2010 Adjusted by: Barbera Setters. Alexandria Lodge [name] PharmD [name]   Return to anticoagulation clinic:  05/29/2010 Time of next visit: 1630    Allergies: No Known Drug Allergies  Appended Document: COU/CH I have requested that an appointment be scheduled with patient's PCP for routine medical follow-up.

## 2010-06-26 ENCOUNTER — Ambulatory Visit (INDEPENDENT_AMBULATORY_CARE_PROVIDER_SITE_OTHER): Payer: PRIVATE HEALTH INSURANCE | Admitting: Pharmacist

## 2010-06-26 DIAGNOSIS — Z7901 Long term (current) use of anticoagulants: Secondary | ICD-10-CM

## 2010-06-26 DIAGNOSIS — D6859 Other primary thrombophilia: Secondary | ICD-10-CM

## 2010-06-26 DIAGNOSIS — Z86711 Personal history of pulmonary embolism: Secondary | ICD-10-CM

## 2010-06-26 NOTE — Progress Notes (Signed)
Anti-Coagulation Progress Note  Ronnie Fowler is a 45 y.o. male who is currently on an anti-coagulation regimen.    RECENT RESULTS: Recent results are below, the most recent result is correlated with a dose of 35 mg. per week: Lab Results  Component Value Date   INR 2.7* 06/26/2010   INR 2.7 05/29/2010   INR 2.8 04/24/2010    ANTI-COAG DOSE:   Latest dosing instructions   Total [name]   35 5 mg 5 mg 5 mg 5 mg 5 mg 5 mg 5 mg    (5 mg1) (5 mg1) (5 mg1) (5 mg1) (5 mg1) (5 mg1) (5 mg1)         ANTICOAG SUMMARY: Anticoagulation Episode Summary              Current INR goal 2.0-3.0 Next INR check 07/24/2010   INR from last check 2.7 (06/26/2010)     Weekly max dose (mg)  Target end date    Indications Long-term (current) use of anticoagulants, Protein C deficiency, History of pulmonary embolism, Hypercoagulable state, primary   INR check location [facility] Pref[facility]    Send INR reminders to [facility]   Comments        Provider Role Specialty Phone number   Blanch Media  Internal Medicine [phone]        ANTICOAG TODAY: Anticoagulation Summary as of 06/26/2010              INR goal 2.0-3.0     Selected INR 2.7 (06/26/2010) Next INR check 07/24/2010   Weekly max dose (mg)  Target end date    Indications Long-term (current) use of anticoagulants, Protein C deficiency, History of pulmonary embolism, Hypercoagulable state, primary    Anticoagulation Episode Summary              INR check location [facility] Preferred lab    Send INR reminders to [name]   Comments        Provider Role Specialty Phone number   Blanch Media  Internal Medicine [phone]        PATIENT INSTRUCTIONS: Patient Instructions  Patient instructed to take medications as defined in the Anti-coagulation Track section of this encounter.  Patient instructed to take today's dose.  Patient verbalized understanding of these instructions.          FOLLOW-UP Return in 4 weeks (on 07/24/2010) for Follow up INR.  [name], [name], [name]

## 2010-06-26 NOTE — Patient Instructions (Signed)
Patient instructed to take medications as defined in the Anti-coagulation Track section of this encounter.  Patient instructed to take today's dose.  Patient verbalized understanding of these instructions.    

## 2010-06-28 ENCOUNTER — Encounter: Payer: Self-pay | Admitting: Internal Medicine

## 2010-07-24 ENCOUNTER — Ambulatory Visit (INDEPENDENT_AMBULATORY_CARE_PROVIDER_SITE_OTHER): Payer: PRIVATE HEALTH INSURANCE | Admitting: Pharmacist

## 2010-07-24 DIAGNOSIS — D6859 Other primary thrombophilia: Secondary | ICD-10-CM

## 2010-07-24 DIAGNOSIS — Z86711 Personal history of pulmonary embolism: Secondary | ICD-10-CM

## 2010-07-24 DIAGNOSIS — Z7901 Long term (current) use of anticoagulants: Secondary | ICD-10-CM

## 2010-07-24 LAB — POCT INR: INR: 3.1

## 2010-07-24 NOTE — Patient Instructions (Signed)
Patient instructed to take medications as defined in the Anti-coagulation Track section of this encounter.  Patient instructed to take today's dose.  Patient verbalized understanding of these instructions.    

## 2010-07-24 NOTE — Progress Notes (Signed)
Anti-Coagulation Progress Note  Ronnie Fowler is a 45 y.o. male who is currently on an anti-coagulation regimen.    RECENT RESULTS: Recent results are below, the most recent result is correlated with a dose of 35 mg. per week: Lab Results  Component Value Date   INR 3.1 07/24/2010   INR 2.7* 06/26/2010   INR 2.7 05/29/2010    ANTI-COAG DOSE:   Latest dosing instructions   Total [name]   35 5 mg 5 mg 5 mg 5 mg 5 mg 5 mg 5 mg    (5 mg1) (5 mg1) (5 mg1) (5 mg1) (5 mg1) (5 mg1) (5 mg1)         ANTICOAG SUMMARY: Anticoagulation Episode Summary              Current INR goal 2.0-3.0 Next INR check 08/21/2010   INR from last check 3.1! (07/24/2010)     Weekly max dose (mg)  Target end date Indefinite   Indications Long-term (current) use of anticoagulants, Protein C deficiency, History of pulmonary embolism, Hypercoagulable state, primary   [facility] check location [facility] Preferred lab    Send INR reminders to [facility]   Comments        Provider Role Specialty Phone number   Blanch Media  Internal Medicine [phone]        ANTICOAG TODAY: Anticoagulation Summary as of 07/24/2010              INR goal 2.0-3.0     Selected INR 3.1! (07/24/2010) Next INR check 08/21/2010   Weekly max dose (mg)  Target end date Indefinite   Indications Long-term (current) use of anticoagulants, Protein C deficiency, History of pulmonary embolism, Hypercoagulable state, primary    Anticoagulation Episode Summary              INR check location [facility] Preferred lab    Send INR reminders to [facility]   Comments        Provider Role Specialty Phone number   Blanch Media  Internal Medicine [phone]        PATIENT INSTRUCTIONS: Patient Instructions  Patient instructed to take medications as defined in the Anti-coagulation Track section of this encounter.  Patient instructed to take today's dose.  Patient verbalized understanding of  these instructions.        FOLLOW-UP Return in 4 weeks (on 08/21/2010) for Follow up INR.  [name], [name], [name]

## 2010-08-16 ENCOUNTER — Encounter: Payer: Self-pay | Admitting: Internal Medicine

## 2010-08-21 ENCOUNTER — Ambulatory Visit (INDEPENDENT_AMBULATORY_CARE_PROVIDER_SITE_OTHER): Payer: PRIVATE HEALTH INSURANCE | Admitting: Pharmacist

## 2010-08-21 DIAGNOSIS — Z7901 Long term (current) use of anticoagulants: Secondary | ICD-10-CM

## 2010-08-21 DIAGNOSIS — D6859 Other primary thrombophilia: Secondary | ICD-10-CM

## 2010-08-21 DIAGNOSIS — Z86711 Personal history of pulmonary embolism: Secondary | ICD-10-CM

## 2010-08-21 MED ORDER — WARFARIN SODIUM 5 MG PO TABS: ORAL_TABLET | ORAL | Status: DC

## 2010-08-21 NOTE — Patient Instructions (Signed)
Patient instructed to take medications as defined in the Anti-coagulation Track section of this encounter.  Patient instructed to take today's dose.  Patient verbalized understanding of these instructions.    

## 2010-08-21 NOTE — Progress Notes (Signed)
Anti-Coagulation Progress Note  Ronnie Fowler is a 45 y.o. male who is currently on an anti-coagulation regimen.    RECENT RESULTS: Recent results are below, the most recent result is correlated with a dose of 35 mg. per week: Lab Results  Component Value Date   INR 2.7 08/21/2010   INR 3.1 07/24/2010   INR 2.7* 06/26/2010    ANTI-COAG DOSE:   Latest dosing instructions   Total [name]   35 5 mg 5 mg 5 mg 5 mg 5 mg 5 mg 5 mg    (5 mg1) (5 mg1) (5 mg1) (5 mg1) (5 mg1) (5 mg1) (5 mg1)         ANTICOAG SUMMARY: Anticoagulation Episode Summary              Current INR goal 2.0-3.0 Next INR check 09/18/2010   INR from last check 2.7 (08/21/2010)     Weekly max dose (mg)  Target end date Indefinite   Indications Long-term (current) use of anticoagulants, Protein C deficiency, History of pulmonary embolism, Hypercoagulable state, primary   INR check location [facility] Preferred lab    Send INR reminders to [facility]   Comments        Provider Role Specialty Phone number   Blanch Media  Internal Medicine [phone]        ANTICOAG TODAY: Anticoagulation Summary as of 08/21/2010              INR goal 2.0-3.0     Selected INR 2.7 (08/21/2010) Next INR check 09/18/2010   Weekly max dose (mg)  Target end date Indefinite   Indications Long-term (current) use of anticoagulants, Protein C deficiency, History of pulmonary embolism, Hypercoagulable state, primary    Anticoagulation Episode Summary              INR check location [facility] Preferred lab    Send INR reminders to [name]   Comments        Provider Role Specialty Phone number   Blanch Media  Internal Medicine [phone]        PATIENT INSTRUCTIONS: Patient Instructions  Patient instructed to take medications as defined in the Anti-coagulation Track section of this encounter.  Patient instructed to take today's dose.  Patient verbalized understanding of  these instructions.        FOLLOW-UP Return in 4 weeks (on 09/18/2010) for Follow up INR.  [name], [name], [name]

## 2010-08-30 ENCOUNTER — Ambulatory Visit (INDEPENDENT_AMBULATORY_CARE_PROVIDER_SITE_OTHER): Payer: PRIVATE HEALTH INSURANCE | Admitting: Internal Medicine

## 2010-08-30 ENCOUNTER — Encounter: Payer: Self-pay | Admitting: Internal Medicine

## 2010-08-30 VITALS — BP 140/83 | HR 61 | Temp 96.9°F | Ht 68.0 in | Wt 194.2 lb

## 2010-08-30 DIAGNOSIS — M25519 Pain in unspecified shoulder: Secondary | ICD-10-CM

## 2010-08-30 DIAGNOSIS — M25512 Pain in left shoulder: Secondary | ICD-10-CM

## 2010-08-30 DIAGNOSIS — R1012 Left upper quadrant pain: Secondary | ICD-10-CM

## 2010-08-30 LAB — BASIC METABOLIC PANEL
BUN: 16 mg/dL (ref 6–23)
CO2: 22 mEq/L (ref 19–32)
Chloride: 106 mEq/L (ref 96–112)
Creat: 0.98 mg/dL (ref 0.40–1.50)
Glucose, Bld: 112 mg/dL — ABNORMAL HIGH (ref 70–99)
Potassium: 3.7 mEq/L (ref 3.5–5.3)

## 2010-08-30 MED ORDER — TRAMADOL HCL 50 MG PO TABS: 50.0000 mg | ORAL_TABLET | Freq: Three times a day (TID) | ORAL | Status: AC | PRN

## 2010-08-30 NOTE — Patient Instructions (Signed)
Please make an appointment in 2- 3 weeks wigth Dr. Allena Katz. Please get the Xray done and also take the pain medication as prescribed. Stop taking Benicar.

## 2010-08-30 NOTE — Assessment & Plan Note (Addendum)
Considering his history and physical examination, likely muscular strain. But we'll rule out any joint changes with plain x-ray. Will give tramadol to control his pain for now, as NSAIDS would be tricky to do with warfarin. We'll follow him in 2 weeks in clinic.
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
## 2010-08-30 NOTE — Assessment & Plan Note (Signed)
He was worried about having an ulcer in his stomach after seeing an TV ad about aspirin. He thought aspirin and warfarin has same mechanism and so was worried, explaining about the difference. Physical exam is benign in terms of his abdomen. We'll check B. met and CBC and given tramadol for now and follow him in 2 weeks. He works at a place which involves more physical activity and this might be probably muscular skeletal pain.

## 2010-08-31 LAB — CBC
MCH: 28.5 pg (ref 26.0–34.0)
MCHC: 34.4 g/dL (ref 30.0–36.0)
MCV: 82.8 fL (ref 78.0–100.0)
Platelets: 204 10*3/uL (ref 150–400)
RDW: 13.9 % (ref 11.5–15.5)

## 2010-09-18 ENCOUNTER — Encounter: Payer: Self-pay | Admitting: Internal Medicine

## 2010-09-18 ENCOUNTER — Ambulatory Visit (INDEPENDENT_AMBULATORY_CARE_PROVIDER_SITE_OTHER): Payer: PRIVATE HEALTH INSURANCE | Admitting: Pharmacist

## 2010-09-18 ENCOUNTER — Ambulatory Visit (INDEPENDENT_AMBULATORY_CARE_PROVIDER_SITE_OTHER): Payer: PRIVATE HEALTH INSURANCE | Admitting: Internal Medicine

## 2010-09-18 VITALS — BP 155/94 | HR 50 | Temp 97.1°F | Ht 68.0 in | Wt 195.3 lb

## 2010-09-18 DIAGNOSIS — D6859 Other primary thrombophilia: Secondary | ICD-10-CM

## 2010-09-18 DIAGNOSIS — Z7901 Long term (current) use of anticoagulants: Secondary | ICD-10-CM

## 2010-09-18 DIAGNOSIS — Z86711 Personal history of pulmonary embolism: Secondary | ICD-10-CM

## 2010-09-18 DIAGNOSIS — M25512 Pain in left shoulder: Secondary | ICD-10-CM

## 2010-09-18 DIAGNOSIS — M25519 Pain in unspecified shoulder: Secondary | ICD-10-CM

## 2010-09-18 LAB — POCT INR: INR: 2.8

## 2010-09-18 NOTE — Assessment & Plan Note (Signed)
Resolved. We'll hold on shoulder x-ray for now considering clinical improvement.

## 2010-09-18 NOTE — Progress Notes (Signed)
Anti-Coagulation Progress Note  Ronnie Fowler is a 45 y.o. male who is currently on an anti-coagulation regimen.    RECENT RESULTS: Recent results are below, the most recent result is correlated with a dose of 35 mg. per week: Lab Results  Component Value Date   INR 2.8 09/18/2010   INR 2.7 08/21/2010   INR 3.1 07/24/2010    ANTI-COAG DOSE:   Latest dosing instructions   Total [name]   35 5 mg 5 mg 5 mg 5 mg 5 mg 5 mg 5 mg    (5 mg1) (5 mg1) (5 mg1) (5 mg1) (5 mg1) (5 mg1) (5 mg1)         ANTICOAG SUMMARY: Anticoagulation Episode Summary              Current INR goal 2.0-3.0 Next INR check 10/16/2010   INR from last check 2.8 (09/18/2010)     Weekly max dose (mg)  Target end date Indefinite   Indications Long-term (current) use of anticoagulants, Protein C deficiency, History of pulmonary embolism, Hypercoagulable state, primary   INR check location [facility] Preferred lab    Send INR reminders to [facility]   Comments        Provider Role Specialty Phone number   Blanch Media  Internal Medicine [phone]        ANTICOAG TODAY: Anticoagulation Summary as of 09/18/2010              INR goal 2.0-3.0     Selected INR 2.8 (09/18/2010) Next INR check 10/16/2010   Weekly max dose (mg)  Target end date Indefinite   Indications Long-term (current) use of anticoagulants, Protein C deficiency, History of pulmonary embolism, Hypercoagulable state, primary    Anticoagulation Episode Summary              INR check location [facility] Preferred lab    Send INR reminders to [name]   Comments        Provider Role Specialty Phone number   Blanch Media  Internal Medicine [phone]        PATIENT INSTRUCTIONS: Patient Instructions  Patient instructed to take medications as defined in the Anti-coagulation Track section of this encounter.  Patient instructed to take today's dose.  Patient verbalized understanding of  these instructions.        FOLLOW-UP Return in 4 weeks (on 10/16/2010) for Follow up INR.  [name], [name], [name]

## 2010-09-18 NOTE — Progress Notes (Signed)
  Subjective:    Patient ID: Ronnie Fowler, male    DOB: 09-Nov-1965, 45 y.o.   MRN: [id number]  HPI [name] is a pleasant 45 year old man with past with history of protein C deficiency and PE on her Coumadin therapy for by Dr. Alexandria Lodge, comes to the clinic for a followup visit for his left shoulder pain. He was seen by me on August 30 2010 and was diagnosed with likely musculoskeletal pain of his left shoulder and was sent home on tramadol. He is not complaining of any pain in the shoulder today and says tramadol is helping. We'll hold on for the shoulder x-ray which he hasn't done because his pain got better with medication. Denies any fever, chills, chest pain pressures of breath, cough, abdominal pain, constipation, diarrhea.     Review of Systems    as per history of present illness Objective:   Physical Exam    Constitutional: Vital signs reviewed.  Patient is a well-developed and well-nourished in no acute distress and cooperative with exam. Alert and oriented x3.  Head: Normocephalic and atraumatic Mouth: no erythema or exudates, MMM Eyes: PERRL, EOMI, conjunctivae normal, No scleral icterus.  Neck: Supple, Trachea midline normal ROM, No JVD, mass, thyromegaly, or carotid bruit present.  Cardiovascular: RRR, S1 normal, S2 normal, no MRG, pulses symmetric and intact bilaterally Pulmonary/Chest: CTAB, no wheezes, rales, or rhonchi Abdominal: Soft. Non-tender, non-distended, bowel sounds are normal, no masses, organomegaly, or guarding present.  Musculoskeletal: No joint deformities, erythema, or stiffness, ROM full and no nontender Neurological: A&O x3, Strenght is normal and symmetric bilaterally, cranial nerve II-XII are grossly intact, no focal motor deficit, sensory intact to light touch bilaterally.  Skin: Warm, dry and intact. No rash, cyanosis, or clubbing.        Assessment & Plan:

## 2010-09-18 NOTE — Patient Instructions (Addendum)
Please make a follow up appointment in 3 to 4 months. If you buy the BP machine at home, the value should be less than 140/90. That is systolic BP- hg and diastolic- 90 mm hg. For regular exercise at least for 30 minutes daily for 5 days a week and decrease the salt intake for less than 2000 mg daily.

## 2010-09-18 NOTE — Patient Instructions (Signed)
Patient instructed to take medications as defined in the Anti-coagulation Track section of this encounter.  Patient instructed to take today's dose.  Patient verbalized understanding of these instructions.    

## 2010-09-22 NOTE — Consult Note (Signed)
Ronnie Fowler, Ronnie Fowler NO.:  [id number]   MEDICAL RECORD NO.:  [id number]          PATIENT TYPE:  INP   LOCATION:  4703                         FACILITY:  [facility]   PHYSICIAN:  Di Kindle. 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   HISTORY:  This is a pleasant 45 year old gentleman who had a pulmonary  embolus in November of  2003. At that time, he was placed on Coumadin, and  he believes this was discontinued in August of 2006. 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 I believe the plan was then to get him  back on Coumadin. He presented several days ago with acute onset of chest  pain and was found to have a recurrent pulmonary embolus. 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 Subsequent CT angiogram  confirmed an aneurysm involving the right popliteal vein right behind the  popliteal fossa measuring approximately 2 cm in maximum diameter. Vascular  surgery was consulted for further recommendations.  The consult was from Dr.  Darrick Huntsman.   This patient is unaware of any family history of hypercoagulable states.   PAST MEDICAL HISTORY:  Fairly unremarkable. He denies any history of  diabetes, hypertension, hypercholesterolemia, history of previous myocardial  infarction, or history of congestive heart failure.   PAST SURGICAL HISTORY:  Unremarkable.   SOCIAL HISTORY:  He is married. He has two children. He moved from [place]  approximately seven years ago. 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 He does not use  tobacco and never has used tobacco.   FAMILY HISTORY:  Mother had diabetes.  Father had a stroke. Both are living.  He is unaware of any history of premature cardiovascular disease.   REVIEW OF SYSTEMS:  He has had no recent claudication, rest pain, or non-  healing ulcers. He has had no history of stroke, TIAs, or amaurosis fugax.  Review of systems is documented in the medical history form in his chart.   PHYSICAL EXAMINATION:  VITAL SIGNS:  Temperatures 100.3 blood pressure  120/81, heart rate 82.  HEENT:  I did not detect any carotid bruits.  LUNGS:  Clear bilaterally to auscultation.  CARDIAC EXAM:  He has a regular rate and rhythm.  ABDOMEN:  Soft and nontender. I cannot palpate an aneurysm. He has normal  femoral,  popliteal, dorsalis pedis, and posterior tibial pulses  bilaterally. He has no significant lower extremity swelling. He has no  evidence of chronic venous insufficiency. He does have multiple wounds on  his legs from previous soccer injuries.  Of note, with him standing, I really  could not palpate his aneurysm behind the popliteal space on the right.   LABORATORY EVALUATION:  INR of 2.0. Review of his ultrasound shows a dilated  right popliteal vein.   CT angiogram shows a fusiform aneurysm behind the right popliteal space  measuring about 2 cm in maximum diameter.   IMPRESSION:  This patient presents with a recurrent pulmonary embolism off  of Coumadin therapy. Given his protein C deficiency, I would agree that he  would require lifetime Coumadin for this hypercoagulable state. I think it  is very likely that he did, in fact, develop some clot in the right  popliteal venous aneurysm given that he had some pain here prior to the  event. Currently, there is no evidence of clot seen in the aneurysm on  ultrasound nor any other evidence of DVT by Duplex. He does have a moderate-  sized  aneurysm of this area. However, the risk associated with repairing an  aneurysm in this area are significant including the extremely high incidence  of thrombotic complications and very likely, he would end up with a  thrombosed popliteal vein and potentially long-term sequelae from this  including chronic venous insufficiency. In addition, the posterior exposure  to repair this has been associated with a very high risk of traction  injuries on the nerves potentially causing a foot drop. In addition, it  would be hard to plicate the vein to really prevent recurrent DVT. I would  recommend continued Coumadin therapy lifetime to help prevent formation of  clot in this aneurysm and would at this point not recommend repair as I  think the potential complications outweigh the potential benefits. I will  arrange to see him back in the office in one year and will follow this by  ultrasound, probably on a yearly basis. He knows to call sooner if he has  problems.      Di Kindle. Edilia Bo, M.D.  Electronically Signed     CSD/MEDQ  D:  05/15/2005  T:  05/16/2005  Job:  [id number]   cc:   Duncan Dull, M.D.  Fax: [phone]

## 2010-09-22 NOTE — H&P (Signed)
NAME:  Ronnie Fowler, Ronnie Fowler NO.:  [id number]   MEDICAL RECORD NO.:  [id number]                   PATIENT TYPE:   LOCATION:                                       FACILITY:  [facility]   PHYSICIAN:  Asencion Partridge, M.D.                  DATE OF BIRTH:  [birth year]   DATE OF ADMISSION:  03/18/2002  DATE OF DISCHARGE:                                HISTORY & PHYSICAL   CHIEF COMPLAINT:  Left-sided chest pain.   HISTORY OF PRESENT ILLNESS:  This is a 45 year old African American sent  from urgent care after a workup for pleuritic chest pain and chest x-ray  showing a wedge-shaped density at the left base.  The patient was  transferred to [facility] for a CT of the chest, which revealed  bilateral pulmonary emboli.  The patient presented with a five-day history  of left-sided chest pain, worse when stepping down with force, and he  described the pain as sharp, worse with inspiration.  Tylenol did not  relieve his pain.  He has no history of similar symptoms in the past.  He  does report some increased dyspnea secondary to pain, but no nausea or  vomiting, no fever or chills.  The patient did not miss any work, despite  the pain.  He denies any recent travel or surgeries or long periods of  bedrest.   PAST MEDICAL HISTORY:  None.   PAST SURGICAL HISTORY:  None.   MEDICATIONS:  None.   ALLERGIES:  No known drug allergies.   SOCIAL HISTORY:  The patient lives in [place] with his roommate, who is a  Educational psychologist.  He moved from [place] three years and eight months ago.  He is  married with two children (two daughters, 63 and 63 years old).  All of his  family is still in [place] and he sends them money when he can.  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  He denies tobacco or alcohol use.   FAMILY HISTORY:  Paternal grandfather with CVA.  Father with CVA.  Mother  with diabetes mellitus.  No family history of  coronary artery disease,  cancer or clotting disorder.   REVIEW OF SYSTEMS:  Review of systems negative for fevers, change in weight,  diarrhea, constipation or easy bruising.  The patient does state history of  hematochezia which he attributes to piles.   PHYSICAL EXAMINATION:  VITAL SIGNS:  Temperature 98.5, pulse 65,  respirations 18, blood pressure 154/88, oxygen saturation 97% on room air.  GENERAL:  This is a very friendly African American male in no acute distress  who is sitting up on the stretcher.  HEENT:  PERRL.  EOMI.  TMs intact to  light reflex bilaterally.  Nares without discharge.  Oropharynx clear.  NECK:  Neck supple.  No lymphadenopathy.  Trachea midline.  No thyromegaly.  CARDIOVASCULAR:  Regular rate and rhythm without murmurs, rubs, or gallops.  LUNGS:  Lungs clear to auscultation bilaterally with good effort.  ABDOMEN:  Abdomen soft and nontender.  Normoactive bowel sounds.  No  hepatomegaly.  EXTREMITIES:  No clubbing, cyanosis, or edema.  No calf tenderness or  palpable cords.  NEUROLOGIC:  Cranial nerves II-XII grossly intact.  Nonfocal exam.   LABORATORY AND ACCESSORY CLINICAL DATA:  CT of the chest shows extensive  bilateral pulmonary emboli and atelectasis at the left base and probable  small lingular infarct.   EKG shows normal sinus rhythm with no abnormality.   ASSESSMENT AND PLAN:  This is a 45 year old African American with no  significant past medical history, who presents with bilateral pulmonary  emboli after a five-day history of pleuritic chest pain.   Bilateral pulmonary emboli, new onset and etiology uncertain.  Patient  without risk factors for pulmonary emboli such as surgery or prolonged  bedrest, no family history of clotting disorders either.  Will start heparin  and Coumadin for now and get social work consult to discuss possible funding  for home Lovenox as this patient does not have insurance.  We will check a  complete blood count,  PT/INR, PTT, protein C, protein S, factor V Leiden and  antithrombin [name] to rule out clotting disorder.  Monitor oxygen saturation  and give oxygen to keep saturation above 93%.  Pain control with Vicodin.  Also, will obtain lower extremity Doppler to rule out deep venous  thrombosis.     Billey Gosling, M.D.                       Asencion Partridge, M.D.    AS/MEDQ  D:  03/18/2002  T:  03/18/2002  Job:  [id number]   cc:   Reinaldo Raddle. [name], M.D.  [facility]  [address]  [place]  [place] [address]  Fax: [phone]

## 2010-09-22 NOTE — Discharge Summary (Signed)
NAMEDELMONT, PROSCH NO.:  [id number]   MEDICAL RECORD NO.:  [id number]          PATIENT TYPE:  INP   LOCATION:  4703                         FACILITY:  [facility]   PHYSICIAN:  Duncan Dull, M.D.     DATE OF BIRTH:  1966-04-27   DATE OF ADMISSION:  05/12/2005  DATE OF DISCHARGE:  05/16/2005                                 DISCHARGE SUMMARY   ADDENDUM:  Mr. Gubbels will also follow up with Dr. Edilia Bo in approximately one year  for follow-up duplex ultrasound of the lower extremity to follow the  popliteal vein aneurysm in the right lower extremity as per Dr. Adele Dan  request.      Inis Sizer, M.D.      Duncan Dull, M.D.  Electronically Signed    DC/MEDQ  D:  05/16/2005  T:  05/16/2005  Job:  [id number]   cc:   Di Kindle. Edilia Bo, M.D.  [address]  [place]  [place] [address]   Outpatient Clinic

## 2010-09-22 NOTE — Discharge Summary (Signed)
Ronnie Fowler, Ronnie Fowler NO.:  [id number]   MEDICAL RECORD NO.:  [id number]                   PATIENT TYPE:  INP   LOCATION:  3728                                 FACILITY:  [facility]   PHYSICIAN:  Asencion Partridge, M.D.                  DATE OF BIRTH:  [birth year]   DATE OF ADMISSION:  03/18/2002  DATE OF DISCHARGE:  03/19/2002                                 DISCHARGE SUMMARY   DIAGNOSIS:  Bilateral pulmonary emboli, unknown etiology.   PROCEDURE:  CT of the chest which showed extensive bilateral pulmonary  emboli and atelectasis at the left base and probable small lingular infarct.   CONSULTATIONS:  None.   DISCHARGE MEDICATIONS:  1. Lovenox 80 mg [place] b.i.d. x 6-12 months.  2. Coumadin 5 mg p.o. q.d. unless told otherwise by primary physician.  3. Vicodin 1-2 tabs q.4-6h. p.r.n. pain.   DISPOSITION AND FOLLOW-UP:  The patient was stable on discharge to home and  instructed to return to [facility] the next day on March 20, 2002, for a check of his INR.  He will need to return to [facility] also on  March 21, 2002 and March 22, 2002, for INR checks.  He will follow up  with Dr. Reinaldo Raddle. [name], M.D., at [facility] on Sunday, March 22, 2002, between noon and 4 p.m., at which time he may need to stop the  Coumadin depending on his INR.   ADMISSION HISTORY:  This 45 year old African male from [place] was sent to  [facility] from [facility] after being worked up for left-  sided pleuritic chest pain.  A chest x-ray at Urgent Care showed a  questionable density at the left base, and he was sent to [facility] for a  follow-up CT of the chest which revealed extensive bilateral pulmonary  emboli.  He was admitted for initiation of anticoagulation.   HOSPITAL COURSE:  On admission, the patient was in no acute distress, and  his oxygen saturation was 97% on room air, and he was given Vicodin for his  chest pain which  provided him with adequate pain control throughout his  hospitalization.  Coumadin and heparin were started and dosed per pharmacy.  The patient tolerated this well and on day of discharge, patient was changed  to Lovenox, and he heparin was discontinued.  He did qualify for financial  assistance with the Lovenox and received instructions and teaching on how to  administer this at home prior to discharge.  The patient's INR on March 18, 2002, at 8:30 at night was 1.4, and it is recommended that he be on at  least four days of the combined therapy of Coumadin and either heparin or  Lovenox with at least two days at a therapeutic INR before discontinuing the  Coumadin.  The patient will need to be  on the Lovenox for at least six  months to one year as at this time, the etiology of the pulmonary emboli is  uncertain.  A work-up including a peripheral blood smear, protein C, protein  S were obtained, and all was negative except for a slightly low protein C.  As patient was already on heparin, this may just need to be repeated to see  if patient is truly deficient in protein C.  A factor V Leiden was ordered  but then cancelled secondary to issue of cost for patient.  The patient did  well and was stable on day of discharge without shortness of breath and with  good pain control.  He will follow-up with Casimiro Needle P. [name], M.D. at [facility].   LABORATORY DATA:  On March 19, 2002, white blood cell count 7.3,  hemoglobin 14.1, hematocrit 40.7 with an MCV 81.3 and platelet count of 201.  March 18, 2002, at 8:30 p.m., INR was 1.4.  March 18, 2002,  sodium 135, potassium 3.6, BUN 8, creatinine 0.9, glucose 114, AST 18, ALT  24, total protein 7.4, albumin 3.1, calcium 9.0.  Urinalysis was within  normal limits.  Protein S was 87 with a reference range 81-180.  A protein C  was 57 with reference range 91-147.  Antithrombin [name] was 90 with a  reference range 75-120.      Billey Gosling, M.D.                       Asencion Partridge, M.D.    AS/MEDQ  D:  03/20/2002  T:  03/21/2002  Job:  [id number]   cc:   Reinaldo Raddle. [name], M.D.  [facility]  [address]  [place]  [place] [address]  Fax: [phone]   Fax [phone]

## 2010-09-22 NOTE — Discharge Summary (Signed)
Ronnie Fowler, Ronnie NO.:  Fowler   MEDICAL RECORD NO.:  Fowler          PATIENT TYPE:  INP   LOCATION:  4703                         FACILITY:  [facility]   PHYSICIAN:  Inis Sizer, M.D.     DATE OF BIRTH:  1965-08-21   DATE OF ADMISSION:  05/12/2005  DATE OF DISCHARGE:  05/16/2005                                 DISCHARGE SUMMARY   DISCHARGE DIAGNOSES:  1.  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  2.  Protein C deficiency.   DISCHARGE MEDICATIONS:  1.  Coumadin 5 mg p.o. daily.  2.  Tylenol 650 mg p.o. q.6 h. p.r.n. pain.  3.  Vicodin one tab p.o. q.6 h. p.r.n. severe pain.  The patient was advised      that the Tylenol and Vicodin should not be taken together, as Vicodin      does contain Tylenol.   CONDITION AT DISCHARGE:  The patient is improved at the time of discharge.  He no longer has pleuritic chest pain and he is therapeutic on his Coumadin.  We will have him follow-up with Dr. Smitty Cords in the Coumadin Clinic tomorrow,  May 17, 2005 at 4:45 p.m.  He will also follow-up with Dr. Orma Flaming in the  Outpatient Clinic on July 05, 2005 to continue care in the Outpatient  Clinic.  He was instructed should he have residual chest pain or recurrent  chest pain between now and then he could call the Outpatient Clinic number.   PROCEDURES:  CT angiogram of the chest on May 12, 2005 showed both  occlusive and nonocclusive pulmonary emboli.  Specifically, in the right  middle lobe and the proximal lower lobar artery on the right.  A chest x-ray  done on May 12, 2005 showed mild cardiomegaly and bilateral lower lobe  infiltrates versus atelectasis, which could be secondary to his PE, as seen  on CT angiogram.  A CT angiogram of the aorta and bifemoral area done on  May 15, 2005 showed a 2.1 cm right popliteal vein aneurysm.  Venous  Dopplers done on May 15, 2004 shows no evidence of DVT, superficial  thrombosis, or a Baker's  cyst bilaterally.  There does appear to be dilation  of the popliteal vein suggestive of an aneurysm.   CONSULTATIONS:  Dr. Edilia Bo of [facility] was consulted.   BRIEF ADMITTING HISTORY AND PHYSICAL:  Mr. Rezendes is a 45 year old African  male, who presented with a history of bilateral PEs in November of 2003.  He  was coumadinized for three years until approximately six months ago, at  which time Coumadin was stopped.  He presents with acute onset of right-  sided pleuritic chest pain that started the afternoon of admission.  The  chest pain was preceded by right lower extremity popliteal pain the day  before, for which the patient took Tylenol and Ben-Gay.  He did state that  the chest pain on admission did feel like his previous pulmonary embolus.   PHYSICAL EXAMINATION:  VITAL SIGNS:  His blood pressure was slightly  elevated at 144/87, pulse was 73, respirations were  20.  He was 100% on room  air.  PERTINENT FINDINGS ON PHYSICAL EXAMINATION:  Decreased bilateral breath  sounds at the bases with a poor inspiratory effort likely secondary to  splinting.  There was no edema in the lower extremities, nor palpable cords.  His extremities were nontender and the Denna Haggard' sign was negative.   NOTABLE LABORATORY FINDINGS:  Elevated D-dimer at 0.61 and a slightly  elevated glucose on his BMET of 120.  His point of care markers on admission  were negative.   HOSPITAL COURSE BY PROBLEM:  1.  Recurrent venous thromboembolic disease with PE.  The patient, as stated      above, was admitted with pleuritic type chest pain and a history of PE      in November of 2003.  A recurrent PE was seen on CT angiogram of the      chest at the time of admission.  He had recently within the past six      months had his Coumadin discontinued.  A workup in November of 2003 had      shown a low protein C level at 47.  Per the patient, this had been      repeated approximately a month after his Coumadin was stopped.   Records      were obtained from [facility], which did show a protein C level      of 40 approximately one to two months after Coumadin was stopped.      During this admission, he was started Coumadin.  Venous Dopplers of the      lower extremities did show evidence of a popliteal vein aneurysm in the      right lower extremity.  This was confirmed with a CT angiogram of the      right lower extremity.  [facility] was consulted for a possible surgical      correction of the aneurysm.  However, Dr. Edilia Bo felt that in a patient      with low protein C, who likely require lifetime anticoagulation that the      risks of this procedure would outweigh the benefits.  Therefore, the      patient was continued on Coumadin until he was therapeutic for greater      than 24 hours, at which time his Lovenox was discontinued.  He will be      discharged on Coumadin and will follow-up with our Coumadin Clinic to      make sure that he continues to be therapeutic.  Of note, on reviewing      the previous anticoagulation workup, it was noted a prothrombin gene      mutation was not performed so this was ordered and is still pending at      the time of discharge.  Also, antiphospholipid antibodies were rechecked      during this hospitalization and the patient was with elevation of his      PTT/LA, as well as his dRVVT.  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      These labs only further justify that he will need to be on lifelong      Coumadin.  2.  Febrile.  The patient did have several fevers while in the hospital.      All were treated with Tylenol and resolved.  The patient was completely  asymptomatic in terms of urinary, respiratory, or other symptoms.  His      white count was normal.  Therefore, these fevers were felt to be      secondary to his pulmonary embolus.  Should he develop symptoms, he     should call the  Outpatient Clinic for further evaluation.  3.  Transient hyperglycemia.  A fasting CBG was performed that was 116      consistent with glucose intolerant, or a slight impairment.  The patient      denied any symptoms of polyuria or polydipsia, or any previous diagnosis      of diabetes.  His A1C was normal at 5.5.  We will follow this up as an      outpatient, but for now no treatment is necessary.  4.  Elevated blood pressure.  During the hospitalization, the patient did      have an elevated blood pressure at the beginning of his hospital stay of      142/88.  However, at the time of discharge, this has normalized.  We      will re-evaluate this as an outpatient, but for now we will not start      him on any medications for his blood pressure.  5.  Cardiovascular risk assessment, as the patient had not had any      cardiovascular risks assessed, and we did perform a fasting lipid panel,      and this was normal.   DISCHARGE LABORATORY AND VITAL SIGNS:  Vital signs on the last day of the  hospitalization showed a temperature of 99.5, pulse of 77, respirations 20,  blood pressure 118/78.  He was 96% on room air.  His last set of labs did  show a white blood cell count of 6.6, hemoglobin 14.5, hematocrit 42.4,  platelets of 253.  A BMET showed a sodium of 138, potassium 3.9, chloride  103, bicarb 24, BUN 11, creatinine 1.1, glucose of 101, calcium 9.3.  His PT  on the day of discharge was 2.2.  His A1C was 5.5.  His lipid panel showed a  total cholesterol of 130, a triglyceride 65, HDL 48, and LDL 69.  His  antiphospholipid screen showed a PTT/LA of 68.4, a PTT/LA confirmation of  20.7, a PTT/LA 4 to 1 mix of 67.1.  Of note, all of these were considered to  be elevated in the given  reference range.  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  At the time of this dictation, his  prothrombin gene mutation is still pending.      Inis Sizer, M.D.     DC/MEDQ  D:  05/16/2005  T:  05/16/2005  Job:  [id number]   cc:   Inis Sizer, M.D.   Di Kindle. Edilia Bo, M.D.  [address]  [place]  [place] [address]

## 2010-10-16 ENCOUNTER — Ambulatory Visit: Payer: PRIVATE HEALTH INSURANCE

## 2010-11-20 ENCOUNTER — Ambulatory Visit (INDEPENDENT_AMBULATORY_CARE_PROVIDER_SITE_OTHER): Payer: PRIVATE HEALTH INSURANCE | Admitting: Pharmacist

## 2010-11-20 DIAGNOSIS — Z86711 Personal history of pulmonary embolism: Secondary | ICD-10-CM

## 2010-11-20 DIAGNOSIS — D6859 Other primary thrombophilia: Secondary | ICD-10-CM

## 2010-11-20 DIAGNOSIS — Z7901 Long term (current) use of anticoagulants: Secondary | ICD-10-CM

## 2010-11-20 MED ORDER — WARFARIN SODIUM 5 MG PO TABS: ORAL_TABLET | ORAL | Status: DC

## 2010-11-20 NOTE — Progress Notes (Signed)
Anti-Coagulation Progress Note  Ronnie Fowler is a 45 y.o. male who is currently on an anti-coagulation regimen.    RECENT RESULTS: Recent results are below, the most recent result is correlated with a dose of 35 mg. per week: Lab Results  Component Value Date   INR 3.40 11/20/2010   INR 2.8 09/18/2010   INR 2.7 08/21/2010    ANTI-COAG DOSE:   Latest dosing instructions   Total [name]   32.5 5 mg 2.5 mg 5 mg 5 mg 5 mg 5 mg 5 mg    (5 mg1) (5 mg0.5) (5 mg1) (5 mg1) (5 mg1) (5 mg1) (5 mg1)         ANTICOAG SUMMARY: Anticoagulation Episode Summary              Current INR goal 2.0-3.0 Next INR check 12/18/2010   INR from last check 3.40! (11/20/2010)     Weekly max dose (mg)  Target end date Indefinite   Indications Long-term (current) use of anticoagulants, Protein C deficiency, History of pulmonary embolism, Hypercoagulable state, primary   INR check location [facility] Preferred lab    Send INR reminders to [facility]   Comments        Provider Role Specialty Phone number   Blanch Media  Internal Medicine [phone]        ANTICOAG TODAY: Anticoagulation Summary as of 11/20/2010              INR goal 2.0-3.0     Selected INR 3.40! (11/20/2010) Next INR check 12/18/2010   Weekly max dose (mg)  Target end date Indefinite   Indications Long-term (current) use of anticoagulants, Protein C deficiency, History of pulmonary embolism, Hypercoagulable state, primary    Anticoagulation Episode Summary              INR check location [facility] Preferred lab    Send INR reminders to [name]   Comments        Provider Role Specialty Phone number   Blanch Media  Internal Medicine [phone]        PATIENT INSTRUCTIONS: Patient Instructions  Patient instructed to take medications as defined in the Anti-coagulation Track section of this encounter.  Patient instructed to take today's dose.  Patient verbalized  understanding of these instructions.        FOLLOW-UP Return in 4 weeks (on 12/18/2010) for Follow up INR.  [name], [name], [name]

## 2010-11-20 NOTE — Patient Instructions (Signed)
Patient instructed to take medications as defined in the Anti-coagulation Track section of this encounter.  Patient instructed to take today's dose.  Patient verbalized understanding of these instructions.    

## 2010-12-18 ENCOUNTER — Ambulatory Visit (INDEPENDENT_AMBULATORY_CARE_PROVIDER_SITE_OTHER): Payer: PRIVATE HEALTH INSURANCE | Admitting: Pharmacist

## 2010-12-18 DIAGNOSIS — Z86711 Personal history of pulmonary embolism: Secondary | ICD-10-CM

## 2010-12-18 DIAGNOSIS — D6859 Other primary thrombophilia: Secondary | ICD-10-CM

## 2010-12-18 DIAGNOSIS — Z7901 Long term (current) use of anticoagulants: Secondary | ICD-10-CM

## 2010-12-18 LAB — POCT INR: INR: 2.7

## 2010-12-18 NOTE — Patient Instructions (Signed)
Patient instructed to take medications as defined in the Anti-coagulation Track section of this encounter.  Patient instructed to take today's dose.  Patient verbalized understanding of these instructions.    

## 2010-12-18 NOTE — Progress Notes (Signed)
Agree with the plan.

## 2010-12-18 NOTE — Progress Notes (Signed)
Anti-Coagulation Progress Note  Ronnie Fowler is a 45 y.o. male who is currently on an anti-coagulation regimen.    RECENT RESULTS: Recent results are below, the most recent result is correlated with a dose of 32.5 mg. per week: Lab Results  Component Value Date   INR 2.70 12/18/2010   INR 3.40 11/20/2010   INR 2.8 09/18/2010    ANTI-COAG DOSE:   Latest dosing instructions   Total [name]   32.5 5 mg 2.5 mg 5 mg 5 mg 5 mg 5 mg 5 mg    (5 mg1) (5 mg0.5) (5 mg1) (5 mg1) (5 mg1) (5 mg1) (5 mg1)         ANTICOAG SUMMARY: Anticoagulation Episode Summary              Current INR goal 2.0-3.0 Next INR check 01/15/2011   INR from last check 2.70 (12/18/2010)     Weekly max dose (mg)  Target end date Indefinite   Indications Long-term (current) use of anticoagulants, Protein C deficiency, History of pulmonary embolism, Hypercoagulable state, primary   INR check location [facility] Preferred lab    Send INR reminders to [facility]   Comments        Provider Role Specialty Phone number   Blanch Media  Internal Medicine [phone]        ANTICOAG TODAY: Anticoagulation Summary as of 12/18/2010              INR goal 2.0-3.0     Selected INR 2.70 (12/18/2010) Next INR check 01/15/2011   Weekly max dose (mg)  Target end date Indefinite   Indications Long-term (current) use of anticoagulants, Protein C deficiency, History of pulmonary embolism, Hypercoagulable state, primary    Anticoagulation Episode Summary              INR check location [facility] Preferred lab    Send INR reminders to [name]   Comments        Provider Role Specialty Phone number   Blanch Media  Internal Medicine [phone]        PATIENT INSTRUCTIONS: Patient Instructions  Patient instructed to take medications as defined in the Anti-coagulation Track section of this encounter.  Patient instructed to take today's dose.  Patient verbalized  understanding of these instructions.        FOLLOW-UP Return in 4 weeks (on 01/15/2011) for Follow up INR.  [name], [name], [name]

## 2011-01-15 ENCOUNTER — Ambulatory Visit (INDEPENDENT_AMBULATORY_CARE_PROVIDER_SITE_OTHER): Payer: PRIVATE HEALTH INSURANCE | Admitting: Pharmacist

## 2011-01-15 DIAGNOSIS — Z86711 Personal history of pulmonary embolism: Secondary | ICD-10-CM

## 2011-01-15 DIAGNOSIS — D6859 Other primary thrombophilia: Secondary | ICD-10-CM

## 2011-01-15 DIAGNOSIS — Z7901 Long term (current) use of anticoagulants: Secondary | ICD-10-CM

## 2011-01-15 NOTE — Progress Notes (Signed)
Anti-Coagulation Progress Note  Ronnie Fowler is a 45 y.o. male who is currently on an anti-coagulation regimen.    RECENT RESULTS: Recent results are below, the most recent result is correlated with a dose of 32.5 mg. per week: Lab Results  Component Value Date   INR 3.2 01/15/2011   INR 2.70 12/18/2010   INR 3.40 11/20/2010    ANTI-COAG DOSE:   Latest dosing instructions   Total [name]   30 5 mg 2.5 mg 5 mg 5 mg 2.5 mg 5 mg 5 mg    (5 mg1) (5 mg0.5) (5 mg1) (5 mg1) (5 mg0.5) (5 mg1) (5 mg1)         ANTICOAG SUMMARY: Anticoagulation Episode Summary              Current INR goal 2.0-3.0 Next INR check 02/12/2011   INR from last check 3.2! (01/15/2011)     Weekly max dose (mg)  Target end date Indefinite   Indications Long-term (current) use of anticoagulants, Protein C deficiency, History of pulmonary embolism, Hypercoagulable state, primary   INR check location [facility] Preferred lab    Send INR reminders to [facility]   Comments        Provider Role Specialty Phone number   Blanch Media  Internal Medicine [phone]        ANTICOAG TODAY: Anticoagulation Summary as of 01/15/2011              INR goal 2.0-3.0     Selected INR 3.2! (01/15/2011) Next INR check 02/12/2011   Weekly max dose (mg)  Target end date Indefinite   Indications Long-term (current) use of anticoagulants, Protein C deficiency, History of pulmonary embolism, Hypercoagulable state, primary    Anticoagulation Episode Summary              INR check location [facility] Preferred lab    Send INR reminders to [facility]   Comments        Provider Role Specialty Phone number   Blanch Media  Internal Medicine [phone]        PATIENT INSTRUCTIONS: Patient Instructions  Patient instructed to take medications as defined in the Anti-coagulation Track section of this encounter.  Patient instructed to take today's dose.  Patient verbalized  understanding of these instructions.  Take 1x5mg  tablets on ALL DAYS EXCEPT on Mondays and Thursdays (on Mondays and Thursdays--take ONLY 1/2 x 5mg  tablet). Return to clinic on Monday February 12, 2011 at 4:45PM.      FOLLOW-UP Return in 4 weeks (on 02/12/2011) for Follow up INR.  [name], [name], [name]

## 2011-01-15 NOTE — Patient Instructions (Signed)
Patient instructed to take medications as defined in the Anti-coagulation Track section of this encounter.  Patient instructed to take today's dose.  Patient verbalized understanding of these instructions.  Take 1x5mg  tablets on ALL DAYS EXCEPT on Mondays and Thursdays (on Mondays and Thursdays--take ONLY 1/2 x 5mg  tablet). Return to clinic on Monday February 12, 2011 at 4:45PM.

## 2011-01-16 ENCOUNTER — Encounter: Payer: Self-pay | Admitting: Internal Medicine

## 2011-01-18 NOTE — Progress Notes (Signed)
Patient had recurrent PEs. Protein C deficiency and elevated antiphospholipid antibodies in past. Detailed evaluation done in 2003 and 2007. He will need lifelong anticoagulation. 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 Edilia Bo thought that benefit would be less as he will need lifelong anticoagulations anyways

## 2011-02-12 ENCOUNTER — Ambulatory Visit (INDEPENDENT_AMBULATORY_CARE_PROVIDER_SITE_OTHER): Payer: PRIVATE HEALTH INSURANCE | Admitting: Pharmacist

## 2011-02-12 DIAGNOSIS — Z7901 Long term (current) use of anticoagulants: Secondary | ICD-10-CM

## 2011-02-12 DIAGNOSIS — Z86711 Personal history of pulmonary embolism: Secondary | ICD-10-CM

## 2011-02-12 DIAGNOSIS — D6859 Other primary thrombophilia: Secondary | ICD-10-CM

## 2011-02-12 LAB — POCT INR: INR: 2.3

## 2011-02-12 NOTE — Progress Notes (Signed)
Anti-Coagulation Progress Note  Ronnie Fowler is a 45 y.o. male who is currently on an anti-coagulation regimen.    RECENT RESULTS: Recent results are below, the most recent result is correlated with a dose of 30 mg. per week: Lab Results  Component Value Date   INR 2.3 02/12/2011   INR 3.2 01/15/2011   INR 2.70 12/18/2010    ANTI-COAG DOSE:   Latest dosing instructions   Total [name]   30 5 mg 2.5 mg 5 mg 5 mg 2.5 mg 5 mg 5 mg    (5 mg1) (5 mg0.5) (5 mg1) (5 mg1) (5 mg0.5) (5 mg1) (5 mg1)         ANTICOAG SUMMARY: Anticoagulation Episode Summary              Current INR goal 2.0-3.0 Next INR check 03/12/2011   INR from last check 2.3 (02/12/2011)     Weekly max dose (mg)  Target end date Indefinite   Indications Long-term (current) use of anticoagulants, Protein C deficiency, History of pulmonary embolism, Hypercoagulable state, primary   INR check location [facility] Preferred lab    Send INR reminders to [facility]   Comments        Provider Role Specialty Phone number   Blanch Media  Internal Medicine [phone]        ANTICOAG TODAY: Anticoagulation Summary as of 02/12/2011              INR goal 2.0-3.0     Selected INR 2.3 (02/12/2011) Next INR check 03/12/2011   Weekly max dose (mg)  Target end date Indefinite   Indications Long-term (current) use of anticoagulants, Protein C deficiency, History of pulmonary embolism, Hypercoagulable state, primary    Anticoagulation Episode Summary              INR check location [facility] Preferred lab    Send INR reminders to [name]   Comments        Provider Role Specialty Phone number   Blanch Media  Internal Medicine [phone]        PATIENT INSTRUCTIONS: Patient Instructions  Patient instructed to take medications as defined in the Anti-coagulation Track section of this encounter.  Patient instructed to take today's dose.  Patient verbalized  understanding of these instructions.        FOLLOW-UP Return in 4 weeks (on 03/12/2011) for Follow up INR.  [name], [name], [name]

## 2011-02-12 NOTE — Patient Instructions (Signed)
Patient instructed to take medications as defined in the Anti-coagulation Track section of this encounter.  Patient instructed to take today's dose.  Patient verbalized understanding of these instructions.    

## 2011-03-12 ENCOUNTER — Ambulatory Visit (INDEPENDENT_AMBULATORY_CARE_PROVIDER_SITE_OTHER): Payer: PRIVATE HEALTH INSURANCE | Admitting: Pharmacist

## 2011-03-12 DIAGNOSIS — D6859 Other primary thrombophilia: Secondary | ICD-10-CM

## 2011-03-12 DIAGNOSIS — Z86711 Personal history of pulmonary embolism: Secondary | ICD-10-CM

## 2011-03-12 DIAGNOSIS — Z7901 Long term (current) use of anticoagulants: Secondary | ICD-10-CM

## 2011-03-12 LAB — POCT INR: INR: 2.1

## 2011-03-12 MED ORDER — WARFARIN SODIUM 5 MG PO TABS: ORAL_TABLET | ORAL | Status: DC

## 2011-03-12 NOTE — Progress Notes (Signed)
Anti-Coagulation Progress Note  Jyair Kiraly is a 45 y.o. male who is currently on an anti-coagulation regimen.    RECENT RESULTS: Recent results are below, the most recent result is correlated with a dose of 30 mg. per week: Lab Results  Component Value Date   INR 2.10 03/12/2011   INR 2.3 02/12/2011   INR 3.2 01/15/2011    ANTI-COAG DOSE:   Latest dosing instructions   Total [name]   32.5 5 mg 5 mg 5 mg 2.5 mg 5 mg 5 mg 5 mg    (5 mg1) (5 mg1) (5 mg1) (5 mg0.5) (5 mg1) (5 mg1) (5 mg1)         ANTICOAG SUMMARY: Anticoagulation Episode Summary              Current INR goal 2.0-3.0 Next INR check 04/02/2011   INR from last check 2.10 (03/12/2011)     Weekly max dose (mg)  Target end date Indefinite   Indications Long-term (current) use of anticoagulants, Protein C deficiency, History of pulmonary embolism, Hypercoagulable state, primary   INR check location [facility] Preferred lab    Send INR reminders to [facility]   Comments        Provider Role Specialty Phone number   Blanch Media  Internal Medicine [phone]        ANTICOAG TODAY: Anticoagulation Summary as of 03/12/2011              INR goal 2.0-3.0     Selected INR 2.10 (03/12/2011) Next INR check 04/02/2011   Weekly max dose (mg)  Target end date Indefinite   Indications Long-term (current) use of anticoagulants, Protein C deficiency, History of pulmonary embolism, Hypercoagulable state, primary    Anticoagulation Episode Summary              INR check location [facility] Preferred lab    Send INR reminders to [name]   Comments        Provider Role Specialty Phone number   Blanch Media  Internal Medicine [phone]        PATIENT INSTRUCTIONS: Patient Instructions  Patient instructed to take medications as defined in the Anti-coagulation Track section of this encounter.  Patient instructed to take today's dose.  Patient verbalized  understanding of these instructions.        FOLLOW-UP Return in 3 weeks (on 04/02/2011) for Follow up INR.  [name], [name], [name]

## 2011-03-12 NOTE — Patient Instructions (Signed)
Patient instructed to take medications as defined in the Anti-coagulation Track section of this encounter.  Patient instructed to take today's dose.  Patient verbalized understanding of these instructions.    

## 2011-04-02 ENCOUNTER — Ambulatory Visit (INDEPENDENT_AMBULATORY_CARE_PROVIDER_SITE_OTHER): Payer: PRIVATE HEALTH INSURANCE | Admitting: Pharmacist

## 2011-04-02 DIAGNOSIS — Z7901 Long term (current) use of anticoagulants: Secondary | ICD-10-CM

## 2011-04-02 DIAGNOSIS — Z86711 Personal history of pulmonary embolism: Secondary | ICD-10-CM

## 2011-04-02 DIAGNOSIS — D6859 Other primary thrombophilia: Secondary | ICD-10-CM

## 2011-04-02 NOTE — Progress Notes (Signed)
Anti-Coagulation Progress Note  Epic Tribbett is a 45 y.o. male who is currently on an anti-coagulation regimen.    RECENT RESULTS: Recent results are below, the most recent result is correlated with a dose of 32.5 mg. per week: Lab Results  Component Value Date   INR 2.70 04/02/2011   INR 2.10 03/12/2011   INR 2.3 02/12/2011    ANTI-COAG DOSE:   Latest dosing instructions   Total [name]   35 5 mg 5 mg 5 mg 5 mg 5 mg 5 mg 5 mg    (5 mg1) (5 mg1) (5 mg1) (5 mg1) (5 mg1) (5 mg1) (5 mg1)         ANTICOAG SUMMARY: Anticoagulation Episode Summary              Current INR goal 2.0-3.0 Next INR check 05/14/2011   INR from last check 2.70 (04/02/2011)     Weekly max dose (mg)  Target end date Indefinite   Indications Long-term (current) use of anticoagulants, Protein C deficiency, History of pulmonary embolism, Hypercoagulable state, primary   INR check location [facility] Preferred lab    Send INR reminders to [facility]   Comments        Provider Role Specialty Phone number   Blanch Media  Internal Medicine [phone]        ANTICOAG TODAY: Anticoagulation Summary as of 04/02/2011              INR goal 2.0-3.0     Selected INR 2.70 (04/02/2011) Next INR check 05/14/2011   Weekly max dose (mg)  Target end date Indefinite   Indications Long-term (current) use of anticoagulants, Protein C deficiency, History of pulmonary embolism, Hypercoagulable state, primary    Anticoagulation Episode Summary              INR check location [facility] Preferred lab    Send INR reminders to [name]   Comments        Provider Role Specialty Phone number   Blanch Media  Internal Medicine [phone]        PATIENT INSTRUCTIONS: Patient Instructions  Patient instructed to take medications as defined in the Anti-coagulation Track section of this encounter.  Patient instructed to take today's dose.  Patient verbalized  understanding of these instructions.  Patient instructed to take ONE tablet of his warfarin 5mg  strength tablets by mouth daily until next clinic appointment.      FOLLOW-UP Return in 6 weeks (on 05/14/2011) for Follow up INR.  [name], [name], [name]

## 2011-04-02 NOTE — Patient Instructions (Signed)
Patient instructed to take medications as defined in the Anti-coagulation Track section of this encounter.  Patient instructed to take today's dose.  Patient verbalized understanding of these instructions.  Patient instructed to take ONE tablet of his warfarin 5mg  strength tablets by mouth daily until next clinic appointment.

## 2011-04-04 ENCOUNTER — Telehealth: Payer: Self-pay | Admitting: Pharmacist

## 2011-04-04 DIAGNOSIS — D6859 Other primary thrombophilia: Secondary | ICD-10-CM

## 2011-04-04 DIAGNOSIS — Z86711 Personal history of pulmonary embolism: Secondary | ICD-10-CM

## 2011-04-04 DIAGNOSIS — Z7901 Long term (current) use of anticoagulants: Secondary | ICD-10-CM

## 2011-04-04 MED ORDER — WARFARIN SODIUM 5 MG PO TABS: ORAL_TABLET | ORAL | Status: DC

## 2011-04-24 ENCOUNTER — Encounter: Payer: Self-pay | Admitting: Internal Medicine

## 2011-04-24 ENCOUNTER — Other Ambulatory Visit (HOSPITAL_COMMUNITY): Payer: Self-pay | Admitting: Internal Medicine

## 2011-04-24 DIAGNOSIS — Z86718 Personal history of other venous thrombosis and embolism: Secondary | ICD-10-CM

## 2011-04-24 DIAGNOSIS — R0602 Shortness of breath: Secondary | ICD-10-CM

## 2011-04-24 DIAGNOSIS — Z7901 Long term (current) use of anticoagulants: Secondary | ICD-10-CM

## 2011-04-25 ENCOUNTER — Encounter (HOSPITAL_COMMUNITY): Payer: PRIVATE HEALTH INSURANCE | Admitting: Radiology

## 2011-05-14 ENCOUNTER — Ambulatory Visit: Payer: PRIVATE HEALTH INSURANCE

## 2011-06-04 ENCOUNTER — Ambulatory Visit (INDEPENDENT_AMBULATORY_CARE_PROVIDER_SITE_OTHER): Payer: PRIVATE HEALTH INSURANCE | Admitting: Pharmacist

## 2011-06-04 DIAGNOSIS — Z86711 Personal history of pulmonary embolism: Secondary | ICD-10-CM

## 2011-06-04 DIAGNOSIS — D6859 Other primary thrombophilia: Secondary | ICD-10-CM

## 2011-06-04 DIAGNOSIS — Z7901 Long term (current) use of anticoagulants: Secondary | ICD-10-CM

## 2011-06-04 NOTE — Progress Notes (Signed)
Anti-Coagulation Progress Note  Cylas Falzone is a 46 y.o. male who is currently on an anti-coagulation regimen.    RECENT RESULTS: Recent results are below, the most recent result is correlated with a dose of 35 mg. per week: Lab Results  Component Value Date   INR 3.00 06/04/2011   INR 2.70 04/02/2011   INR 2.10 03/12/2011    ANTI-COAG DOSE:   Latest dosing instructions   Total [name]   30 5 mg 2.5 mg 5 mg 5 mg 2.5 mg 5 mg 5 mg    (5 mg1) (5 mg0.5) (5 mg1) (5 mg1) (5 mg0.5) (5 mg1) (5 mg1)         ANTICOAG SUMMARY: Anticoagulation Episode Summary              Current INR goal 2.0-3.0 Next INR check 07/02/2011   INR from last check 3.00 (06/04/2011)     Weekly max dose (mg)  Target end date Indefinite   Indications Long-term (current) use of anticoagulants, Protein C deficiency, History of pulmonary embolism, Hypercoagulable state, primary   INR check location [facility] Preferred lab    Send INR reminders to [name]   Comments        Provider Role Specialty Phone number   Blanch Media, MD  Internal Medicine [phone]        ANTICOAG TODAY: Anticoagulation Summary as of 06/04/2011              INR goal 2.0-3.0     Selected INR 3.00 (06/04/2011) Next INR check 07/02/2011   Weekly max dose (mg)  Target end date Indefinite   Indications Long-term (current) use of anticoagulants, Protein C deficiency, History of pulmonary embolism, Hypercoagulable state, primary    Anticoagulation Episode Summary              INR check location [facility] Preferred lab    Send INR reminders to [name]   Comments        Provider Role Specialty Phone number   Blanch Media, MD  Internal Medicine [phone]        PATIENT INSTRUCTIONS: Patient Instructions  Patient instructed to take medications as defined in the Anti-coagulation Track section of this encounter.  Patient instructed to take today's dose.  Patient  verbalized understanding of these instructions.        FOLLOW-UP Return in 4 weeks (on 07/02/2011) for Follow up INR.  [name], [name], [name]

## 2011-06-04 NOTE — Patient Instructions (Signed)
Patient instructed to take medications as defined in the Anti-coagulation Track section of this encounter.  Patient instructed to take today's dose.  Patient verbalized understanding of these instructions.    

## 2011-06-12 ENCOUNTER — Telehealth: Payer: Self-pay | Admitting: Oncology

## 2011-06-12 NOTE — Telephone Encounter (Signed)
called pt to scheduled a new pt appt for the week of 02/04 and pt request to schedule appt for 07/02/2011.  will fax over a letter to Dr. Julio Sicks

## 2011-06-13 ENCOUNTER — Telehealth: Payer: Self-pay | Admitting: Oncology

## 2011-06-13 ENCOUNTER — Other Ambulatory Visit: Payer: Self-pay | Admitting: Oncology

## 2011-06-13 DIAGNOSIS — Z86711 Personal history of pulmonary embolism: Secondary | ICD-10-CM

## 2011-06-13 NOTE — Telephone Encounter (Signed)
Del.Chart °

## 2011-06-14 ENCOUNTER — Ambulatory Visit: Payer: PRIVATE HEALTH INSURANCE | Admitting: Oncology

## 2011-06-14 ENCOUNTER — Ambulatory Visit: Payer: PRIVATE HEALTH INSURANCE

## 2011-06-14 ENCOUNTER — Other Ambulatory Visit: Payer: PRIVATE HEALTH INSURANCE

## 2011-06-15 ENCOUNTER — Telehealth: Payer: Self-pay | Admitting: Hematology & Oncology

## 2011-06-15 NOTE — Telephone Encounter (Signed)
Received referral from Dr. Julio Sicks on 2-7, went to schedule and patient has appointment with Dr. Gaylyn Rong 2-25. Left Amy Clovis Riley message to call, want to make sure patient is not ours.

## 2011-06-18 ENCOUNTER — Encounter: Payer: Self-pay | Admitting: Oncology

## 2011-06-18 DIAGNOSIS — R760 Raised antibody titer: Secondary | ICD-10-CM | POA: Insufficient documentation

## 2011-06-18 DIAGNOSIS — I10 Essential (primary) hypertension: Secondary | ICD-10-CM | POA: Insufficient documentation

## 2011-06-29 ENCOUNTER — Telehealth: Payer: Self-pay | Admitting: Oncology

## 2011-06-29 NOTE — Telephone Encounter (Signed)
pt called to cancel said he will call back on 02/25 to r/s appts

## 2011-07-02 ENCOUNTER — Ambulatory Visit: Payer: PRIVATE HEALTH INSURANCE

## 2011-07-02 ENCOUNTER — Ambulatory Visit (HOSPITAL_BASED_OUTPATIENT_CLINIC_OR_DEPARTMENT_OTHER): Payer: PRIVATE HEALTH INSURANCE | Admitting: Oncology

## 2011-07-02 ENCOUNTER — Ambulatory Visit (INDEPENDENT_AMBULATORY_CARE_PROVIDER_SITE_OTHER): Payer: PRIVATE HEALTH INSURANCE

## 2011-07-02 ENCOUNTER — Ambulatory Visit: Payer: PRIVATE HEALTH INSURANCE | Admitting: Oncology

## 2011-07-02 ENCOUNTER — Other Ambulatory Visit: Payer: PRIVATE HEALTH INSURANCE | Admitting: Lab

## 2011-07-02 DIAGNOSIS — Z7901 Long term (current) use of anticoagulants: Secondary | ICD-10-CM

## 2011-07-02 DIAGNOSIS — D6859 Other primary thrombophilia: Secondary | ICD-10-CM

## 2011-07-02 DIAGNOSIS — R894 Abnormal immunological findings in specimens from other organs, systems and tissues: Secondary | ICD-10-CM

## 2011-07-02 DIAGNOSIS — Z86711 Personal history of pulmonary embolism: Secondary | ICD-10-CM

## 2011-07-02 DIAGNOSIS — I2699 Other pulmonary embolism without acute cor pulmonale: Secondary | ICD-10-CM

## 2011-07-02 DIAGNOSIS — I10 Essential (primary) hypertension: Secondary | ICD-10-CM

## 2011-07-02 LAB — POCT INR: INR: 2.7

## 2011-07-03 NOTE — Progress Notes (Signed)
Patient canceled visit

## 2011-08-03 ENCOUNTER — Ambulatory Visit (INDEPENDENT_AMBULATORY_CARE_PROVIDER_SITE_OTHER): Payer: PRIVATE HEALTH INSURANCE | Admitting: Family Medicine

## 2011-08-03 VITALS — BP 121/78 | HR 82 | Temp 99.5°F | Resp 18 | Ht 68.5 in | Wt 187.0 lb

## 2011-08-03 DIAGNOSIS — IMO0001 Reserved for inherently not codable concepts without codable children: Secondary | ICD-10-CM

## 2011-08-03 DIAGNOSIS — R52 Pain, unspecified: Secondary | ICD-10-CM

## 2011-08-03 DIAGNOSIS — R509 Fever, unspecified: Secondary | ICD-10-CM

## 2011-08-03 LAB — POCT CBC
HCT, POC: 42.7 % — AB (ref 43.5–53.7)
Hemoglobin: 14.4 g/dL (ref 14.1–18.1)
MCH, POC: 28.4 pg (ref 27–31.2)
MCV: 84.3 fL (ref 80–97)
RBC: 5.07 M/uL (ref 4.69–6.13)
WBC: 4.4 10*3/uL — AB (ref 4.6–10.2)

## 2011-08-03 LAB — POCT RAPID STREP A (OFFICE): Rapid Strep A Screen: NEGATIVE

## 2011-08-03 NOTE — Progress Notes (Signed)
Patient Name: Ronnie Fowler Date of Birth: 1965-10-10 Medical Record Number: [id number] Gender: male Date of Encounter: 08/03/2011  History of Present Illness:  Ronnie Fowler is a 46 y.o. very pleasant male patient who presents with the following:  Here with illness for about 3 days.  He notes ST, HA, fever, body aches, some cough.  No GI symptoms.  Chronic use of blood thinners due to hypercoag state and history of PE- he is otherwise generally healthy.  He has not checked his temperature at home  Patient Active Problem List  Diagnoses  . PROTEIN C DEFICIENCY  . URINARY TRACT INFECTION, ACUTE, RECURRENT  . KNEE PAIN, CHRONIC  . BACK PAIN, LUMBAR  . DYSURIA  . PULMONARY EMBOLISM, HX OF  . Long-term (current) use of anticoagulants  . Hypercoagulable state, primary  . Left shoulder pain  . LUQ abdominal pain  . HTN (hypertension)  . Lupus anticoagulant positive   Past Medical History  Diagnosis Date  . PE (pulmonary embolism) 03/2002; 05/2005  . Protein C deficiency   . HTN (hypertension)   . Lupus anticoagulant positive    No past surgical history on file. History  Substance Use Topics  . Smoking status: Never Smoker   . Smokeless tobacco: Not on file  . Alcohol Use: No   No family history on file. No Known Allergies  Medication list has been reviewed and updated.  Review of Systems: As per HPI- otherwise negative.  Physical Examination: Filed Vitals:   08/03/11 1710  BP: 121/78  Pulse: 82  Temp: 99.5 F (37.5 C)  TempSrc: Oral  Resp: 18  Height: 5' 8.5" (1.74 m)  Weight: 187 lb (84.823 kg)    Body mass index is 28.02 kg/(m^2).  GEN: WDWN, NAD, Non-toxic, A & O x 3 HEENT: Atraumatic, Normocephalic. Neck supple. No masses, No LAD.  TM, oropharynx wnl Ears and Nose: No external deformity. CV: RRR, No M/G/R. No JVD. No thrill. No extra heart sounds. PULM: CTA B, no wheezes, crackles, rhonchi. No retractions. No resp. distress. No accessory muscle  use. ABD: S, NT, ND, +BS. No rebound. No HSM. EXTR: No c/c/e NEURO Normal gait.  PSYCH: Normally interactive. Conversant. Not depressed or anxious appearing.  Calm demeanor.   Results for orders placed in visit on 08/03/11  POCT CBC      Component Value Range   WBC 4.4 (*) 4.6 - 10.2 (K/uL)   Lymph, poc 1.1  0.6 - 3.4    POC LYMPH PERCENT 24.6  10 - 50 (%L)   MID (cbc) 0.3  0 - 0.9    POC MID % 7.7  0 - 12 (%M)   POC Granulocyte 3.0  2 - 6.9    Granulocyte percent 67.7  37 - 80 (%G)   RBC 5.07  4.69 - 6.13 (M/uL)   Hemoglobin 14.4  14.1 - 18.1 (g/dL)   HCT, POC 40.9 (*) 81.1 - 53.7 (%)   MCV 84.3  80 - 97 (fL)   MCH, POC 28.4  27 - 31.2 (pg)   MCHC 33.7  31.8 - 35.4 (g/dL)   RDW, POC 91.4     Platelet Count, POC 175  142 - 424 (K/uL)   MPV 8.3  0 - 99.8 (fL)  POCT INFLUENZA A/B      Component Value Range   Influenza A, POC Negative     Influenza B, POC Negative    POCT RAPID STREP A (OFFICE)      Component Value Range  Rapid Strep A Screen Negative  Negative       Assessment and Plan: 1. Aches  POCT CBC, POCT Influenza A/B  2. Fever  POCT CBC, POCT Influenza A/B, POCT rapid strep A   Suspect viral URI.  Gave rx for DMM- use prn. Also may use tylenol as needed for fever and aches.  Let us know if not better in a couple of days- Sooner if worse.

## 2011-09-03 ENCOUNTER — Ambulatory Visit (INDEPENDENT_AMBULATORY_CARE_PROVIDER_SITE_OTHER): Payer: PRIVATE HEALTH INSURANCE | Admitting: Pharmacist

## 2011-09-03 DIAGNOSIS — Z7901 Long term (current) use of anticoagulants: Secondary | ICD-10-CM

## 2011-09-03 DIAGNOSIS — D6859 Other primary thrombophilia: Secondary | ICD-10-CM

## 2011-09-03 DIAGNOSIS — Z86711 Personal history of pulmonary embolism: Secondary | ICD-10-CM

## 2011-09-03 LAB — POCT INR: INR: 2.9

## 2011-09-03 NOTE — Patient Instructions (Signed)
Patient instructed to take medications as defined in the Anti-coagulation Track section of this encounter.  Patient instructed to take today's dose.  Patient verbalized understanding of these instructions.    

## 2011-09-03 NOTE — Progress Notes (Signed)
Anti-Coagulation Progress Note  Ronnie Fowler is a 46 y.o. male who is currently on an anti-coagulation regimen.    RECENT RESULTS: Recent results are below, the most recent result is correlated with a dose of 30 mg. per week: Lab Results  Component Value Date   INR 2.90 09/03/2011   INR 2.7 07/02/2011   INR 3.00 06/04/2011    ANTI-COAG DOSE:   Latest dosing instructions   Total [name]   30 5 mg 2.5 mg 5 mg 5 mg 2.5 mg 5 mg 5 mg    (5 mg1) (5 mg0.5) (5 mg1) (5 mg1) (5 mg0.5) (5 mg1) (5 mg1)         ANTICOAG SUMMARY: Anticoagulation Episode Summary              Current INR goal 2.0-3.0 Next INR check 10/08/2011   INR from last check 2.90 (09/03/2011)     Weekly max dose (mg)  Target end date Indefinite   Indications Long-term (current) use of anticoagulants, Hypercoagulable state, primary   [facility]R check location [facility] Preferred lab    Send INR reminders to [name]   Comments        Provider Role Specialty Phone number   Burns Spain, MD  Internal Medicine [phone]        ANTICOAG TODAY: Anticoagulation Summary as of 09/03/2011              INR goal 2.0-3.0     Selected INR 2.90 (09/03/2011) Next INR check 10/08/2011   Weekly max dose (mg)  Target end date Indefinite   Indications Long-term (current) use of anticoagulants, Hypercoagulable state, primary    Anticoagulation Episode Summary              INR check location [facility] Preferred lab    Send INR reminders to [name]   Comments        Provider Role Specialty Phone number   Burns Spain, MD  Internal Medicine [phone]        PATIENT INSTRUCTIONS: Patient Instructions  Patient instructed to take medications as defined in the Anti-coagulation Track section of this encounter.  Patient instructed to take today's dose.  Patient verbalized understanding of these instructions.        FOLLOW-UP Return in 5 weeks (on 10/08/2011) for Follow  up INR.  [name], [name], [name]

## 2011-10-08 ENCOUNTER — Ambulatory Visit (INDEPENDENT_AMBULATORY_CARE_PROVIDER_SITE_OTHER): Payer: PRIVATE HEALTH INSURANCE | Admitting: Pharmacist

## 2011-10-08 DIAGNOSIS — Z7901 Long term (current) use of anticoagulants: Secondary | ICD-10-CM

## 2011-10-08 DIAGNOSIS — D6859 Other primary thrombophilia: Secondary | ICD-10-CM

## 2011-10-08 DIAGNOSIS — Z86711 Personal history of pulmonary embolism: Secondary | ICD-10-CM

## 2011-10-08 NOTE — Progress Notes (Signed)
Anti-Coagulation Progress Note  Ronnie Fowler is a 46 y.o. male who is currently on an anti-coagulation regimen.    RECENT RESULTS: Recent results are below, the most recent result is correlated with a dose of 30 mg. per week: Lab Results  Component Value Date   INR 2.30 10/08/2011   INR 2.90 09/03/2011   INR 2.7 07/02/2011    ANTI-COAG DOSE:   Latest dosing instructions   Total [name]   32.5 5 mg 5 mg 5 mg 2.5 mg 5 mg 5 mg 5 mg    (5 mg1) (5 mg1) (5 mg1) (5 mg0.5) (5 mg1) (5 mg1) (5 mg1)         ANTICOAG SUMMARY: Anticoagulation Episode Summary              Current INR goal 2.0-3.0 Next INR check 11/05/2011   INR from last check 2.30 (10/08/2011)     Weekly max dose (mg)  Target end date Indefinite   Indications Long-term (current) use of anticoagulants, Hypercoagulable state, primary   INR check location [facility] Preferred lab    Send INR reminders to [name]   Comments        Provider Role Specialty Phone number   Burns Spain, MD  Internal Medicine [phone]        ANTICOAG TODAY: Anticoagulation Summary as of 10/08/2011              INR goal 2.0-3.0     Selected INR 2.30 (10/08/2011) Next INR check 11/05/2011   Weekly max dose (mg)  Target end date Indefinite   Indications Long-term (current) use of anticoagulants, Hypercoagulable state, primary    Anticoagulation Episode Summary              INR check location [facility] Preferred lab    Send INR reminders to [name]   Comments        Provider Role Specialty Phone number   Burns Spain, MD  Internal Medicine [phone]        PATIENT INSTRUCTIONS: Patient Instructions  Patient instructed to take medications as defined in the Anti-coagulation Track section of this encounter.  Patient instructed to take today's dose.  Patient verbalized understanding of these instructions.        FOLLOW-UP Return in 4 weeks (on 11/05/2011) for Follow up INR  at 4:30PM.  [name], [name], [name]

## 2011-10-08 NOTE — Patient Instructions (Signed)
Patient instructed to take medications as defined in the Anti-coagulation Track section of this encounter.  Patient instructed to take today's dose.  Patient verbalized understanding of these instructions.    

## 2011-11-05 ENCOUNTER — Ambulatory Visit (INDEPENDENT_AMBULATORY_CARE_PROVIDER_SITE_OTHER): Payer: PRIVATE HEALTH INSURANCE | Admitting: Pharmacist

## 2011-11-05 DIAGNOSIS — Z7901 Long term (current) use of anticoagulants: Secondary | ICD-10-CM

## 2011-11-05 DIAGNOSIS — D6859 Other primary thrombophilia: Secondary | ICD-10-CM

## 2011-11-05 DIAGNOSIS — Z86711 Personal history of pulmonary embolism: Secondary | ICD-10-CM

## 2011-11-05 NOTE — Progress Notes (Signed)
Anti-Coagulation Progress Note  Ronnie Fowler is a 46 y.o. male who is currently on an anti-coagulation regimen.    RECENT RESULTS: Recent results are below, the most recent result is correlated with a dose of 32.5 mg. per week: Lab Results  Component Value Date   INR 2.60 11/05/2011   INR 2.30 10/08/2011   INR 2.90 09/03/2011    ANTI-COAG DOSE:   Latest dosing instructions   Total [name]   32.5 5 mg 5 mg 5 mg 2.5 mg 5 mg 5 mg 5 mg    (5 mg1) (5 mg1) (5 mg1) (5 mg0.5) (5 mg1) (5 mg1) (5 mg1)         ANTICOAG SUMMARY: Anticoagulation Episode Summary              Current INR goal 2.0-3.0 Next INR check 12/03/2011   INR from last check 2.60 (11/05/2011)     Weekly max dose (mg)  Target end date Indefinite   Indications Long-term (current) use of anticoagulants, Hypercoagulable state, primary   INR check location [facility] Preferred lab    Send INR reminders to [name]   Comments        Provider Role Specialty Phone number   Burns Spain, MD  Internal Medicine [phone]        ANTICOAG TODAY: Anticoagulation Summary as of 11/05/2011              INR goal 2.0-3.0     Selected INR 2.60 (11/05/2011) Next INR check 12/03/2011   Weekly max dose (mg)  Target end date Indefinite   Indications Long-term (current) use of anticoagulants, Hypercoagulable state, primary    Anticoagulation Episode Summary              INR check location [facility] Preferred lab    Send INR reminders to [name]   Comments        Provider Role Specialty Phone number   Burns Spain, MD  Internal Medicine [phone]        PATIENT INSTRUCTIONS: Patient Instructions  Patient instructed to take medications as defined in the Anti-coagulation Track section of this encounter.  Patient instructed to take today's dose.  Patient verbalized understanding of these instructions.        FOLLOW-UP Return in 4 weeks (on 12/03/2011) for Follow up  INR at 4:15PM.  [name], [name], [name]

## 2011-11-05 NOTE — Patient Instructions (Signed)
Patient instructed to take medications as defined in the Anti-coagulation Track section of this encounter.  Patient instructed to take today's dose.  Patient verbalized understanding of these instructions.    

## 2011-12-03 ENCOUNTER — Ambulatory Visit (INDEPENDENT_AMBULATORY_CARE_PROVIDER_SITE_OTHER): Payer: PRIVATE HEALTH INSURANCE | Admitting: Pharmacist

## 2011-12-03 DIAGNOSIS — D6859 Other primary thrombophilia: Secondary | ICD-10-CM

## 2011-12-03 DIAGNOSIS — Z86711 Personal history of pulmonary embolism: Secondary | ICD-10-CM

## 2011-12-03 DIAGNOSIS — Z7901 Long term (current) use of anticoagulants: Secondary | ICD-10-CM

## 2011-12-03 LAB — POCT INR: INR: 2.3

## 2011-12-03 NOTE — Patient Instructions (Signed)
Patient instructed to take medications as defined in the Anti-coagulation Track section of this encounter.  Patient instructed to take today's dose.  Patient verbalized understanding of these instructions.    

## 2011-12-03 NOTE — Progress Notes (Signed)
Anti-Coagulation Progress Note  Ronnie Fowler is a 46 y.o. male who is currently on an anti-coagulation regimen.    RECENT RESULTS: Recent results are below, the most recent result is correlated with a dose of 32.5 mg. per week: Lab Results  Component Value Date   INR 2.30 12/03/2011   INR 2.60 11/05/2011   INR 2.30 10/08/2011    ANTI-COAG DOSE:   Latest dosing instructions   Total [name]   32.5 5 mg 5 mg 5 mg 2.5 mg 5 mg 5 mg 5 mg    (5 mg1) (5 mg1) (5 mg1) (5 mg0.5) (5 mg1) (5 mg1) (5 mg1)         ANTICOAG SUMMARY: Anticoagulation Episode Summary              Current INR goal 2.0-3.0 Next INR check 12/31/2011   INR from last check 2.30 (12/03/2011)     Weekly max dose (mg)  Target end date Indefinite   Indications Long-term (current) use of anticoagulants, Hypercoagulable state, primary   INR check location [facility] Preferred [facility]    Send INR reminders to [name]   Comments        Provider Role Specialty Phone number   Burns Spain, MD  Internal Medicine [phone]        ANTICOAG TODAY: Anticoagulation Summary as of 12/03/2011              INR goal 2.0-3.0     Selected INR 2.30 (12/03/2011) Next INR check 12/31/2011   Weekly max dose (mg)  Target end date Indefinite   Indications Long-term (current) use of anticoagulants, Hypercoagulable state, primary    Anticoagulation Episode Summary              INR check location [facility] Preferred [facility]    Send INR reminders to [name]   Comments        Provider Role Specialty Phone number   Burns Spain, MD  Internal Medicine [phone]        PATIENT INSTRUCTIONS: Patient Instructions  Patient instructed to take medications as defined in the Anti-coagulation Track section of this encounter.  Patient instructed to take today's dose.  Patient verbalized understanding of these instructions.        FOLLOW-UP Return in 4 weeks (on 12/31/2011) for Follow  up INR at 4:15PM.  [name], [name], [name]

## 2011-12-31 ENCOUNTER — Ambulatory Visit (INDEPENDENT_AMBULATORY_CARE_PROVIDER_SITE_OTHER): Payer: PRIVATE HEALTH INSURANCE | Admitting: Pharmacist

## 2011-12-31 DIAGNOSIS — Z7901 Long term (current) use of anticoagulants: Secondary | ICD-10-CM

## 2011-12-31 DIAGNOSIS — D6859 Other primary thrombophilia: Secondary | ICD-10-CM

## 2011-12-31 DIAGNOSIS — Z86711 Personal history of pulmonary embolism: Secondary | ICD-10-CM

## 2011-12-31 LAB — POCT INR: INR: 2.8

## 2011-12-31 NOTE — Patient Instructions (Signed)
Patient instructed to take medications as defined in the Anti-coagulation Track section of this encounter.  Patient instructed to take today's dose.  Patient verbalized understanding of these instructions.    

## 2011-12-31 NOTE — Progress Notes (Signed)
Anti-Coagulation Progress Note  Ronnie Fowler is a 46 y.o. male who is currently on an anti-coagulation regimen.    RECENT RESULTS: Recent results are below, the most recent result is correlated with a dose of 32.5 mg. per week: Lab Results  Component Value Date   INR 2.80 12/31/2011   INR 2.30 12/03/2011   INR 2.60 11/05/2011    ANTI-COAG DOSE:   Latest dosing instructions   Total [name]   32.5 5 mg 5 mg 5 mg 2.5 mg 5 mg 5 mg 5 mg    (5 mg1) (5 mg1) (5 mg1) (5 mg0.5) (5 mg1) (5 mg1) (5 mg1)         ANTICOAG SUMMARY: Anticoagulation Episode Summary              Current INR goal 2.0-3.0 Next INR check 01/28/2012   INR from last check 2.80 (12/31/2011)     Weekly max dose (mg)  Target end date Indefinite   Indications Long-term (current) use of anticoagulants, Hypercoagulable state, primary   INR check location [facility] Preferred [facility]    Send INR reminders to [name]   Comments        Provider Role Specialty Phone number   Burns Spain, MD  Internal Medicine [phone]        ANTICOAG TODAY: Anticoagulation Summary as of 12/31/2011              INR goal 2.0-3.0     Selected INR 2.80 (12/31/2011) Next INR check 01/28/2012   Weekly max dose (mg)  Target end date Indefinite   Indications Long-term (current) use of anticoagulants, Hypercoagulable state, primary    Anticoagulation Episode Summary              INR check location [facility] Preferred [facility]    Send INR reminders to [name]   Comments        Provider Role Specialty Phone number   Burns Spain, MD  Internal Medicine [phone]        PATIENT INSTRUCTIONS: Patient Instructions  Patient instructed to take medications as defined in the Anti-coagulation Track section of this encounter.  Patient instructed to take today's dose.  Patient verbalized understanding of these instructions.        FOLLOW-UP Return in 4 weeks (on 01/28/2012) for  Follow up INR at 4:30PM.  [name], [name], [name]

## 2012-01-28 ENCOUNTER — Ambulatory Visit: Payer: PRIVATE HEALTH INSURANCE

## 2012-03-31 ENCOUNTER — Ambulatory Visit (INDEPENDENT_AMBULATORY_CARE_PROVIDER_SITE_OTHER): Payer: PRIVATE HEALTH INSURANCE | Admitting: Pharmacist

## 2012-03-31 DIAGNOSIS — Z7901 Long term (current) use of anticoagulants: Secondary | ICD-10-CM

## 2012-03-31 DIAGNOSIS — Z86711 Personal history of pulmonary embolism: Secondary | ICD-10-CM

## 2012-03-31 DIAGNOSIS — D6859 Other primary thrombophilia: Secondary | ICD-10-CM

## 2012-03-31 MED ORDER — WARFARIN SODIUM 5 MG PO TABS: ORAL_TABLET | ORAL | Status: DC

## 2012-03-31 NOTE — Patient Instructions (Signed)
Patient instructed to take medications as defined in the Anti-coagulation Track section of this encounter.  Patient instructed to take today's dose.  Patient verbalized understanding of these instructions.    

## 2012-03-31 NOTE — Addendum Note (Signed)
Addended by: [name] B on: 03/31/2012 04:50 PM   Modules accepted: Orders

## 2012-03-31 NOTE — Progress Notes (Signed)
Anti-Coagulation Progress Note  Ronnie Fowler is a 46 y.o. male who is currently on an anti-coagulation regimen.    RECENT RESULTS: Recent results are below, the most recent result is correlated with a dose of 32.5 mg. per week: Patient has been made an appointment with one of our physicians for his continued medical care. Appointment is for January 2014, 15-JAN-14. Lab Results  Component Value Date   INR 2.50 03/31/2012   INR 2.80 12/31/2011   INR 2.30 12/03/2011    ANTI-COAG DOSE:   Latest dosing instructions   Total [name]   32.5 5 mg 5 mg 5 mg 2.5 mg 5 mg 5 mg 5 mg    (5 mg1) (5 mg1) (5 mg1) (5 mg0.5) (5 mg1) (5 mg1) (5 mg1)         ANTICOAG SUMMARY: Anticoagulation Episode Summary              Current INR goal 2.0-3.0 Next INR check 04/21/2012   INR from last check 2.50 (03/31/2012)     Weekly max dose (mg)  Target end date Indefinite   Indications Long-term (current) use of anticoagulants [V58.61], Hypercoagulable state, primary [289.81]   INR check location [facility]    Send INR reminders to [name]   Comments        Provider Role Specialty Phone number   Burns Spain, MD  Internal Medicine [phone]        ANTICOAG TODAY: Anticoagulation Summary as of 03/31/2012              INR goal 2.0-3.0     Selected INR 2.50 (03/31/2012) Next INR check 04/21/2012   Weekly max dose (mg)  Target end date Indefinite   Indications Long-term (current) use of anticoagulants [V58.61], Hypercoagulable state, primary [289.81]    Anticoagulation Episode Summary              INR check location [facility]    Send INR reminders to [name]   Comments        Provider Role Specialty Phone number   Burns Spain, MD  Internal Medicine [phone]        PATIENT INSTRUCTIONS: Patient Instructions  Patient instructed to take medications as defined in the Anti-coagulation Track section  of this encounter.  Patient instructed to take today's dose.  Patient verbalized understanding of these instructions.        FOLLOW-UP Return in 3 weeks (on 04/21/2012) for Follow up INR at 4:30PM.  [name], [name], [name]

## 2012-04-21 ENCOUNTER — Ambulatory Visit (INDEPENDENT_AMBULATORY_CARE_PROVIDER_SITE_OTHER): Payer: PRIVATE HEALTH INSURANCE | Admitting: Pharmacist

## 2012-04-21 DIAGNOSIS — Z86711 Personal history of pulmonary embolism: Secondary | ICD-10-CM

## 2012-04-21 DIAGNOSIS — Z7901 Long term (current) use of anticoagulants: Secondary | ICD-10-CM

## 2012-04-21 DIAGNOSIS — D6859 Other primary thrombophilia: Secondary | ICD-10-CM

## 2012-04-21 NOTE — Patient Instructions (Signed)
Patient instructed to take medications as defined in the Anti-coagulation Track section of this encounter.  Patient instructed to take today's dose.  Patient verbalized understanding of these instructions.    

## 2012-04-21 NOTE — Progress Notes (Signed)
Anti-Coagulation Progress Note  Ronnie Fowler is a 46 y.o. male who is currently on an anti-coagulation regimen.    RECENT RESULTS: Recent results are below, the most recent result is correlated with a dose of 32.5 mg. per week: Lab Results  Component Value Date   INR 2.40 04/21/2012   INR 2.50 03/31/2012   INR 2.80 12/31/2011    ANTI-COAG DOSE:   Latest dosing instructions   Total [name]   32.5 5 mg 5 mg 5 mg 2.5 mg 5 mg 5 mg 5 mg    (5 mg1) (5 mg1) (5 mg1) (5 mg0.5) (5 mg1) (5 mg1) (5 mg1)         ANTICOAG SUMMARY: Anticoagulation Episode Summary              Current INR goal 2.0-3.0 Next INR check 06/02/2012   INR from last check 2.40 (04/21/2012)     Weekly max dose (mg)  Target end date Indefinite   Indications Long-term (current) use of anticoagulants [V58.61], Hypercoagulable state, primary [289.81]   INR check location [facility]erred [facility]    Send INR reminders to [name]   Comments        Provider Role Specialty Phone number   Burns Spain, MD  Internal Medicine [phone]        ANTICOAG TODAY: Anticoagulation Summary as of 04/21/2012              INR goal 2.0-3.0     Selected INR 2.40 (04/21/2012) Next INR check 06/02/2012   Weekly max dose (mg)  Target end date Indefinite   Indications Long-term (current) use of anticoagulants [V58.61], Hypercoagulable state, primary [289.81]    Anticoagulation Episode Summary              INR check location [facility]    Send INR reminders to [name]   Comments        Provider Role Specialty Phone number   Burns Spain, MD  Internal Medicine [phone]        PATIENT INSTRUCTIONS: Patient Instructions  Patient instructed to take medications as defined in the Anti-coagulation Track section of this encounter.  Patient instructed to take today's dose.  Patient verbalized understanding of these instructions.         FOLLOW-UP Return in 6 weeks (on 06/02/2012) for Follow up INR at 4:30PM.  [name], [name], [name]

## 2012-05-05 ENCOUNTER — Other Ambulatory Visit: Payer: Self-pay | Admitting: Pharmacist

## 2012-05-05 DIAGNOSIS — Z7901 Long term (current) use of anticoagulants: Secondary | ICD-10-CM

## 2012-05-05 DIAGNOSIS — Z86711 Personal history of pulmonary embolism: Secondary | ICD-10-CM

## 2012-05-05 DIAGNOSIS — D6859 Other primary thrombophilia: Secondary | ICD-10-CM

## 2012-05-05 MED ORDER — WARFARIN SODIUM 5 MG PO TABS: ORAL_TABLET | ORAL | Status: DC

## 2012-05-14 ENCOUNTER — Encounter: Payer: PRIVATE HEALTH INSURANCE | Admitting: Internal Medicine

## 2012-05-21 ENCOUNTER — Encounter: Payer: PRIVATE HEALTH INSURANCE | Admitting: Internal Medicine

## 2012-05-28 NOTE — Progress Notes (Signed)
Note reviewed.  Agree with plan by Dr. Groce for anti-coagulation.  I did not personally see the patient.   Signed  MULLEN, EMILY  

## 2012-06-02 ENCOUNTER — Ambulatory Visit (INDEPENDENT_AMBULATORY_CARE_PROVIDER_SITE_OTHER): Payer: PRIVATE HEALTH INSURANCE | Admitting: Pharmacist

## 2012-06-02 DIAGNOSIS — Z7901 Long term (current) use of anticoagulants: Secondary | ICD-10-CM

## 2012-06-02 DIAGNOSIS — Z86711 Personal history of pulmonary embolism: Secondary | ICD-10-CM

## 2012-06-02 DIAGNOSIS — D6859 Other primary thrombophilia: Secondary | ICD-10-CM

## 2012-06-02 LAB — POCT INR: INR: 3

## 2012-06-02 NOTE — Patient Instructions (Signed)
Patient instructed to take medications as defined in the Anti-coagulation Track section of this encounter.  Patient instructed to take today's dose.  Patient verbalized understanding of these instructions.    

## 2012-06-02 NOTE — Progress Notes (Signed)
Anti-Coagulation Progress Note  Ronnie Fowler is a 47 y.o. male who is currently on an anti-coagulation regimen.    RECENT RESULTS: Recent results are below, the most recent result is correlated with a dose of 32.5 mg. per week: Lab Results  Component Value Date   INR 3.00 06/02/2012   INR 2.40 04/21/2012   INR 2.50 03/31/2012    ANTI-COAG DOSE:   Latest dosing instructions   Total [name]   30 5 mg 2.5 mg 5 mg 5 mg 2.5 mg 5 mg 5 mg    (5 mg1) (5 mg0.5) (5 mg1) (5 mg1) (5 mg0.5) (5 mg1) (5 mg1)         ANTICOAG SUMMARY: Anticoagulation Episode Summary              Current INR goal 2.0-3.0 Next INR check 06/30/2012   INR from last check 3.00 (06/02/2012)     Weekly max dose (mg)  Target end date Indefinite   Indications Long-term (current) use of anticoagulants [V58.61], Hypercoagulable state, primary [289.81]   INR check location [facility] Preferred lab    Send INR reminders to [name]   Comments        Provider Role Specialty Phone number   Burns Spain, MD  Internal Medicine [phone]        ANTICOAG TODAY: Anticoagulation Summary as of 06/02/2012              INR goal 2.0-3.0     Selected INR 3.00 (06/02/2012) Next INR check 06/30/2012   Weekly max dose (mg)  Target end date Indefinite   Indications Long-term (current) use of anticoagulants [V58.61], Hypercoagulable state, primary [289.81]    Anticoagulation Episode Summary              INR check location [facility]    Send INR reminders to [name]   Comments        Provider Role Specialty Phone number   Burns Spain, MD  Internal Medicine [phone]        PATIENT INSTRUCTIONS: Patient Instructions  Patient instructed to take medications as defined in the Anti-coagulation Track section of this encounter.  Patient instructed to take today's dose.  Patient verbalized understanding of these instructions.         FOLLOW-UP Return in 4 weeks (on 06/30/2012) for Follow up INR at 4:15PM.  [name], [name], [name]

## 2012-06-30 ENCOUNTER — Ambulatory Visit (INDEPENDENT_AMBULATORY_CARE_PROVIDER_SITE_OTHER): Payer: Managed Care, Other (non HMO) | Admitting: Pharmacist

## 2012-06-30 DIAGNOSIS — D6859 Other primary thrombophilia: Secondary | ICD-10-CM

## 2012-06-30 DIAGNOSIS — Z7901 Long term (current) use of anticoagulants: Secondary | ICD-10-CM

## 2012-06-30 LAB — POCT INR: INR: 2.7

## 2012-06-30 NOTE — Progress Notes (Signed)
Anti-Coagulation Progress Note  Ronnie Fowler is a 47 y.o. male who is currently on an anti-coagulation regimen.    RECENT RESULTS: Recent results are below, the most recent result is correlated with a dose of 30 mg. per week: Lab Results  Component Value Date   INR 2.70 06/30/2012   INR 3.00 06/02/2012   INR 2.40 04/21/2012    ANTI-COAG DOSE: Anticoagulation Dose Instructions as of 06/30/2012     [name] Tue Wed Thu Fri Sat   New Dose 5 mg 2.5 mg 5 mg 5 mg 2.5 mg 5 mg 5 mg       ANTICOAG SUMMARY: Anticoagulation Episode Summary   Current INR goal 2.0-3.0  Next INR check 07/28/2012  INR from last check 2.70 (06/30/2012)  Weekly max dose   Target end date Indefinite  [facility] check location [facility]  Preferred lab   Send INR reminders to [name]   Indications  Long-term (current) use of anticoagulants [V58.61] Hypercoagulable state primary [289.81]        Comments       Anticoagulation Care Providers   Provider Role Specialty Phone number   Burns Spain, MD  Internal Medicine [phone]      ANTICOAG TODAY: Anticoagulation Summary as of 06/30/2012   INR goal 2.0-3.0  Selected INR 2.70 (06/30/2012)  Next INR check 07/28/2012  Target end date Indefinite   Indications  Long-term (current) use of anticoagulants [V58.61] Hypercoagulable state primary [289.81]      Anticoagulation Episode Summary   [facility] check location [facility]   Preferred lab    Send INR reminders to [name]   Comments     Anticoagulation Care Providers   Provider Role Specialty Phone number   Burns Spain, MD  Internal Medicine [phone]      PATIENT INSTRUCTIONS: Patient Instructions  Patient instructed to take medications as defined in the Anti-coagulation Track section of this encounter.  Patient instructed to take today's dose.  Patient verbalized understanding of these instructions.       FOLLOW-UP Return in 4 weeks (on 07/28/2012) for Follow up  INR at 4:15PM.  [name], [name], [name]

## 2012-06-30 NOTE — Patient Instructions (Signed)
Patient instructed to take medications as defined in the Anti-coagulation Track section of this encounter.  Patient instructed to take today's dose.  Patient verbalized understanding of these instructions.    

## 2012-07-28 ENCOUNTER — Ambulatory Visit (INDEPENDENT_AMBULATORY_CARE_PROVIDER_SITE_OTHER): Payer: Managed Care, Other (non HMO) | Admitting: Pharmacist

## 2012-07-28 DIAGNOSIS — D6859 Other primary thrombophilia: Secondary | ICD-10-CM

## 2012-07-28 DIAGNOSIS — Z7901 Long term (current) use of anticoagulants: Secondary | ICD-10-CM

## 2012-07-28 NOTE — Patient Instructions (Signed)
Patient instructed to take medications as defined in the Anti-coagulation Track section of this encounter.  Patient instructed to take today's dose.  Patient verbalized understanding of these instructions.    

## 2012-07-28 NOTE — Progress Notes (Signed)
Anti-Coagulation Progress Note  Ronnie Fowler is a 47 y.o. male who is currently on an anti-coagulation regimen.    RECENT RESULTS: Recent results are below, the most recent result is correlated with a dose of 30 mg. per week: Lab Results  Component Value Date   INR 2.60 07/28/2012   INR 2.70 06/30/2012   INR 3.00 06/02/2012    ANTI-COAG DOSE: Anticoagulation Dose Instructions as of 07/28/2012     [name] Tue Wed Thu Fri Sat   New Dose 5 mg 2.5 mg 5 mg 5 mg 2.5 mg 5 mg 5 mg       ANTICOAG SUMMARY: Anticoagulation Episode Summary   Current INR goal 2.0-3.0  Next INR check 08/25/2012  INR from last check 2.60 (07/28/2012)  Weekly max dose   Target end date Indefinite  [facility]  Preferred lab   Send INR reminders to [name]   Indications  Long-term (current) use of anticoagulants [V58.61] Hypercoagulable state primary [289.81]        Comments       Anticoagulation Care Providers   Provider Role Specialty Phone number   Burns Spain, MD  Internal Medicine [phone]      ANTICOAG TODAY: Anticoagulation Summary as of 07/28/2012   INR goal 2.0-3.0  Selected INR 2.60 (07/28/2012)  Next INR check 08/25/2012  Target end date Indefinite   Indications  Long-term (current) use of anticoagulants [V58.61] Hypercoagulable state primary [289.81]      Anticoagulation Episode Summary   [facility] check location [facility]   Preferred lab    Send INR reminders to [name]   Comments     Anticoagulation Care Providers   Provider Role Specialty Phone number   Burns Spain, MD  Internal Medicine [phone]      PATIENT INSTRUCTIONS: Patient Instructions  Patient instructed to take medications as defined in the Anti-coagulation Track section of this encounter.  Patient instructed to take today's dose.  Patient verbalized understanding of these instructions.       FOLLOW-UP Return in 4 weeks (on 08/25/2012) for Follow up  INR at 4PM.  [name], [name], [name]

## 2012-08-06 NOTE — Progress Notes (Signed)
I have reviewed Dr. Saralyn Pilar note.  I agree with his plan as documented.

## 2012-08-25 ENCOUNTER — Ambulatory Visit (INDEPENDENT_AMBULATORY_CARE_PROVIDER_SITE_OTHER): Payer: Managed Care, Other (non HMO) | Admitting: Pharmacist

## 2012-08-25 DIAGNOSIS — Z7901 Long term (current) use of anticoagulants: Secondary | ICD-10-CM

## 2012-08-25 DIAGNOSIS — D6859 Other primary thrombophilia: Secondary | ICD-10-CM

## 2012-08-25 LAB — POCT INR: INR: 2.4

## 2012-08-25 NOTE — Progress Notes (Signed)
Anti-Coagulation Progress Note  Ronnie Fowler is a 47 y.o. male who is currently on an anti-coagulation regimen.    RECENT RESULTS: Recent results are below, the most recent result is correlated with a dose of 30 mg. per week: Lab Results  Component Value Date   INR 2.40 08/25/2012   INR 2.60 07/28/2012   INR 2.70 06/30/2012    ANTI-COAG DOSE: Anticoagulation Dose Instructions as of 08/25/2012     [name] Tue Wed Thu Fri Sat   New Dose 5 mg 5 mg 5 mg 2.5 mg 5 mg 5 mg 5 mg       ANTICOAG SUMMARY: Anticoagulation Episode Summary   Current INR goal 2.0-3.0  Next INR check 09/22/2012  INR from last check 2.40 (08/25/2012)  Weekly max dose   Target end date Indefinite  [facility]  Preferred lab   Send INR reminders to [name]   Indications  Long-term (current) use of anticoagulants [V58.61] Hypercoagulable state primary [289.81]        Comments       Anticoagulation Care Providers   Provider Role Specialty Phone number   Burns Spain, MD  Internal Medicine [phone]      ANTICOAG TODAY: Anticoagulation Summary as of 08/25/2012   INR goal 2.0-3.0  Selected INR 2.40 (08/25/2012)  Next INR check 09/22/2012  Target end date Indefinite   Indications  Long-term (current) use of anticoagulants [V58.61] Hypercoagulable state primary [289.81]      Anticoagulation Episode Summary   [facility] check [facility]   Preferred lab    Send INR reminders to [name]   Comments     Anticoagulation Care Providers   Provider Role Specialty Phone number   Burns Spain, MD  Internal Medicine [phone]      PATIENT INSTRUCTIONS: Patient Instructions  Patient instructed to take medications as defined in the Anti-coagulation Track section of this encounter.  Patient instructed to take today's dose.  Patient verbalized understanding of these instructions.       FOLLOW-UP Return in 4 weeks (on 09/22/2012) for Follow up  INR at 4:15PM.  [name], [name], [name]

## 2012-08-25 NOTE — Patient Instructions (Signed)
Patient instructed to take medications as defined in the Anti-coagulation Track section of this encounter.  Patient instructed to take today's dose.  Patient verbalized understanding of these instructions.    

## 2012-09-04 ENCOUNTER — Ambulatory Visit (INDEPENDENT_AMBULATORY_CARE_PROVIDER_SITE_OTHER): Payer: Managed Care, Other (non HMO) | Admitting: Internal Medicine

## 2012-09-04 ENCOUNTER — Encounter: Payer: Self-pay | Admitting: Internal Medicine

## 2012-09-04 VITALS — BP 132/86 | HR 67 | Temp 97.0°F | Ht 69.0 in | Wt 193.3 lb

## 2012-09-04 DIAGNOSIS — I10 Essential (primary) hypertension: Secondary | ICD-10-CM

## 2012-09-04 DIAGNOSIS — Z7901 Long term (current) use of anticoagulants: Secondary | ICD-10-CM

## 2012-09-04 NOTE — Assessment & Plan Note (Signed)
Continue Coumadin per Dr. Alexandria Lodge.

## 2012-09-04 NOTE — Assessment & Plan Note (Addendum)
BP Readings from Last 3 Encounters:  09/04/12 132/86  08/03/11 121/78  09/18/10 155/94    Lab Results  Component Value Date   NA 138 08/30/2010   K 3.7 08/30/2010   CREATININE 0.98 08/30/2010    Assessment: Blood pressure control:  good Progress toward BP goal:    at goal Comments: blood pressure medications changed from lisinopril to Norvasc recently.  Plan: Medications:  continue current medications, Continue Norvasc. Add lisinopril if needed in future. Educational resources provided: brochure Self management tools provided:   Other plans: add ACE inhibitor is in future if needed for blood pressure control. Avoided going up on dose of HCTZ more than 12.5 mg daily as per Dr. Tami Ribas recommendation.

## 2012-09-04 NOTE — Progress Notes (Signed)
  Subjective:    Patient ID: Ronnie Fowler, male    DOB: Sep 05, 1965, 47 y.o.   MRN: [id number]  HPIpatient is a pleasant 47 year old man with a history of pulmonary embolism and protein C deficiency and lupus anticoagulant positive on chronic anticoagulation, hypertension and other problems as per problem list who comes to the clinic for followup visit after about one and half years.  He was recently seen by Dr. Jacinto Halim with cardiology who has changed his blood pressure medication from lisinopril to Norvasc.  He has been feeling perfectly all right and his back to the clinic to re-establish his care. He does not need any prescriptions today.  Patient denies any symptoms including no nausea, vomiting, fever, chills, headache, palpitations, chest pain, short of breath, abdominal pain, diarrhea.    Review of Systems    As per history of present illness. Objective:   Physical Exam  General: NAD HEENT: PERRL, EOMI, no scleral icterus Cardiac: S1, S2, RRR, no rubs, murmurs or gallops Pulm: clear to auscultation bilaterally, moving normal volumes of air Abd: soft, nontender, nondistended, BS present Ext: warm and well perfused, no pedal edema Neuro: alert and oriented X3, cranial nerves II-XII grossly intact       Assessment & Plan:

## 2012-09-04 NOTE — Patient Instructions (Signed)
Please make followup appointment in 6-8 months.  Continue taking Norvasc daily. And other medications as prescribed.  Call clinic on [phone] any questions or concerns including refills.

## 2012-09-10 NOTE — Progress Notes (Signed)
Case discussed with Dr. Patel soon after the resident saw the patient.  We reviewed the resident's history and exam and pertinent patient test results.  I agree with the assessment, diagnosis and plan of care documented in the resident's note. 

## 2012-09-22 ENCOUNTER — Ambulatory Visit (INDEPENDENT_AMBULATORY_CARE_PROVIDER_SITE_OTHER): Payer: Managed Care, Other (non HMO) | Admitting: Pharmacist

## 2012-09-22 DIAGNOSIS — Z7901 Long term (current) use of anticoagulants: Secondary | ICD-10-CM

## 2012-09-22 DIAGNOSIS — D6859 Other primary thrombophilia: Secondary | ICD-10-CM

## 2012-09-22 NOTE — Patient Instructions (Signed)
Patient instructed to take medications as defined in the Anti-coagulation Track section of this encounter.  Patient instructed to take today's dose.  Patient verbalized understanding of these instructions.    

## 2012-09-22 NOTE — Progress Notes (Signed)
Anti-Coagulation Progress Note  Ronnie Fowler is a 47 y.o. male who is currently on an anti-coagulation regimen.    RECENT RESULTS: Recent results are below, the most recent result is correlated with a dose of 32.5 mg. per week: Lab Results  Component Value Date   INR 2.90 09/22/2012   INR 2.40 08/25/2012   INR 2.60 07/28/2012    ANTI-COAG DOSE: Anticoagulation Dose Instructions as of 09/22/2012     [name] Tue Wed Thu Fri Sat   New Dose 5 mg 2.5 mg 5 mg 5 mg 2.5 mg 5 mg 5 mg       ANTICOAG SUMMARY: Anticoagulation Episode Summary   Current INR goal 2.0-3.0  Next INR check 10/20/2012  INR from last check 2.90 (09/22/2012)  Weekly max dose   Target end date Indefinite  [facility]  Preferred lab   Send INR reminders to [name]   Indications  Long-term (current) use of anticoagulants [V58.61] Hypercoagulable state primary [289.81]        Comments       Anticoagulation Care Providers   Provider Role Specialty Phone number   Burns Spain, MD  Internal Medicine [phone]      ANTICOAG TODAY: Anticoagulation Summary as of 09/22/2012   INR goal 2.0-3.0  Selected INR 2.90 (09/22/2012)  Next INR check 10/20/2012  Target end date Indefinite   Indications  Long-term (current) use of anticoagulants [V58.61] Hypercoagulable state primary [289.81]      Anticoagulation Episode Summary   [facility]R check location [facility]   Preferred lab    Send INR reminders to [name]   Comments     Anticoagulation Care Providers   Provider Role Specialty Phone number   Burns Spain, MD  Internal Medicine [phone]      PATIENT INSTRUCTIONS: Patient Instructions  Patient instructed to take medications as defined in the Anti-coagulation Track section of this encounter.  Patient instructed to take today's dose.  Patient verbalized understanding of these instructions.       FOLLOW-UP Return in 4 weeks (on 10/20/2012) for Follow  up INR at 4:30PM.  [name], [name], [name]

## 2012-10-20 ENCOUNTER — Ambulatory Visit: Payer: Managed Care, Other (non HMO)

## 2012-10-27 ENCOUNTER — Ambulatory Visit: Payer: Managed Care, Other (non HMO)

## 2012-11-03 ENCOUNTER — Ambulatory Visit (INDEPENDENT_AMBULATORY_CARE_PROVIDER_SITE_OTHER): Payer: Managed Care, Other (non HMO) | Admitting: Pharmacist

## 2012-11-03 DIAGNOSIS — D6859 Other primary thrombophilia: Secondary | ICD-10-CM

## 2012-11-03 DIAGNOSIS — Z7901 Long term (current) use of anticoagulants: Secondary | ICD-10-CM

## 2012-11-03 LAB — POCT INR: INR: 2.5

## 2012-11-03 NOTE — Patient Instructions (Signed)
Patient instructed to take medications as defined in the Anti-coagulation Track section of this encounter.  Patient instructed to take today's dose.  Patient verbalized understanding of these instructions.    

## 2012-11-03 NOTE — Progress Notes (Signed)
Anti-Coagulation Progress Note  Melquiades Kovar is a 47 y.o. male who is currently on an anti-coagulation regimen.    RECENT RESULTS: Recent results are below, the most recent result is correlated with a dose of 30 mg. per week: Lab Results  Component Value Date   INR 2.5 11/03/2012   INR 2.90 09/22/2012   INR 2.40 08/25/2012    ANTI-COAG DOSE: Anticoagulation Dose Instructions as of 11/03/2012     [name] Tue Wed Thu Fri Sat   New Dose 5 mg 2.5 mg 5 mg 5 mg 2.5 mg 5 mg 5 mg       ANTICOAG SUMMARY: Anticoagulation Episode Summary   Current INR goal 2.0-3.0  Next INR check 12/22/2012  INR from last check 2.5 (11/03/2012)  Weekly max dose   Target end date Indefinite  [facility]  Preferred lab   Send INR reminders to [name]   Indications  Long-term (current) use of anticoagulants [V58.61] Hypercoagulable state primary [289.81]        Comments       Anticoagulation Care Providers   Provider Role Specialty Phone number   Burns Spain, MD  Internal Medicine [phone]      ANTICOAG TODAY: Anticoagulation Summary as of 11/03/2012   INR goal 2.0-3.0  Selected INR 2.5 (11/03/2012)  Next INR check 12/22/2012  Target end date Indefinite   Indications  Long-term (current) use of anticoagulants [V58.61] Hypercoagulable state primary [289.81]      Anticoagulation Episode Summary   [facility] check location [facility]   Preferred lab    Send INR reminders to [name]   Comments     Anticoagulation Care Providers   Provider Role Specialty Phone number   Burns Spain, MD  Internal Medicine [phone]      PATIENT INSTRUCTIONS: Patient Instructions  Patient instructed to take medications as defined in the Anti-coagulation Track section of this encounter.  Patient instructed to take today's dose.  Patient verbalized understanding of these instructions.       FOLLOW-UP Return in 7 weeks (on 12/22/2012) for Follow up INR  at 4:30PM.  [name], [name], [name]

## 2012-12-22 ENCOUNTER — Ambulatory Visit (INDEPENDENT_AMBULATORY_CARE_PROVIDER_SITE_OTHER): Payer: Managed Care, Other (non HMO) | Admitting: Pharmacist

## 2012-12-22 DIAGNOSIS — D6859 Other primary thrombophilia: Secondary | ICD-10-CM

## 2012-12-22 DIAGNOSIS — Z7901 Long term (current) use of anticoagulants: Secondary | ICD-10-CM

## 2012-12-22 LAB — POCT INR: INR: 2.4

## 2012-12-22 NOTE — Patient Instructions (Signed)
Patient instructed to take medications as defined in the Anti-coagulation Track section of this encounter.  Patient instructed to take today's dose.  Patient verbalized understanding of these instructions.    

## 2012-12-22 NOTE — Progress Notes (Signed)
Anti-Coagulation Progress Note  Ronnie Fowler is a 47 y.o. male who is currently on an anti-coagulation regimen.    RECENT RESULTS: Recent results are below, the most recent result is correlated with a dose of 30 mg. per week: Lab Results  Component Value Date   INR 2.40 12/22/2012   INR 2.5 11/03/2012   INR 2.90 09/22/2012    ANTI-COAG DOSE: Anticoagulation Dose Instructions as of 12/22/2012     [name] Tue Wed Thu Fri Sat   New Dose 5 mg 2.5 mg 5 mg 5 mg 2.5 mg 5 mg 5 mg       ANTICOAG SUMMARY: Anticoagulation Episode Summary   Current INR goal 2.0-3.0  Next INR check 01/19/2013  INR from last check 2.40 (12/22/2012)  Weekly max dose   Target end date Indefinite  [facility] check location [facility]  Preferred lab   Send INR reminders to [name]   Indications  Long-term (current) use of anticoagulants [V58.61] Hypercoagulable state primary [289.81]        Comments       Anticoagulation Care Providers   Provider Role Specialty Phone number   Burns Spain, MD  Internal Medicine [phone]      ANTICOAG TODAY: Anticoagulation Summary as of 12/22/2012   INR goal 2.0-3.0  Selected INR 2.40 (12/22/2012)  Next INR check 01/19/2013  Target end date Indefinite   Indications  Long-term (current) use of anticoagulants [V58.61] Hypercoagulable state primary [289.81]      Anticoagulation Episode Summary   [facility] check location [facility]   Preferred lab    Send INR reminders to [name]   Comments     Anticoagulation Care Providers   Provider Role Specialty Phone number   Burns Spain, MD  Internal Medicine [phone]      PATIENT INSTRUCTIONS: Patient Instructions  Patient instructed to take medications as defined in the Anti-coagulation Track section of this encounter.  Patient instructed to take today's dose.  Patient verbalized understanding of these instructions.       FOLLOW-UP Return in 4 weeks (on 01/19/2013) for Follow up  INR at 4:15PM.  [name], [name], [name]

## 2013-01-11 ENCOUNTER — Ambulatory Visit (INDEPENDENT_AMBULATORY_CARE_PROVIDER_SITE_OTHER): Payer: PRIVATE HEALTH INSURANCE | Admitting: Family Medicine

## 2013-01-11 VITALS — BP 132/82 | HR 58 | Temp 97.9°F | Resp 16 | Ht 69.0 in | Wt 193.0 lb

## 2013-01-11 DIAGNOSIS — I10 Essential (primary) hypertension: Secondary | ICD-10-CM

## 2013-01-11 DIAGNOSIS — B86 Scabies: Secondary | ICD-10-CM

## 2013-01-11 MED ORDER — AMLODIPINE BESYLATE 10 MG PO TABS: 10.0000 mg | ORAL_TABLET | Freq: Every day | ORAL | Status: DC

## 2013-01-11 MED ORDER — IVERMECTIN 3 MG PO TABS: 3.0000 mg | ORAL_TABLET | Freq: Once | ORAL | Status: DC

## 2013-01-11 MED ORDER — PREDNISONE 20 MG PO TABS: ORAL_TABLET | ORAL | Status: DC

## 2013-01-11 NOTE — Patient Instructions (Addendum)
Scabies  Scabies are small bugs (mites) that burrow under the skin and cause red bumps and severe itching. These bugs can only be seen with a microscope. Scabies are highly contagious. They can spread easily from person to person by direct contact. They are also spread through sharing clothing or linens that have the scabies mites living in them. It is not unusual for an entire family to become infected through shared towels, clothing, or bedding.   HOME CARE INSTRUCTIONS   · Your caregiver may prescribe a cream or lotion to kill the mites. If cream is prescribed, massage the cream into the entire body from the neck to the bottom of both feet. Also massage the cream into the scalp and face if your child is less than 1 year old. Avoid the eyes and mouth. Do not wash your hands after application.  · Leave the cream on for 8 to 12 hours. Your child should bathe or shower after the 8 to 12 hour application period. Sometimes it is helpful to apply the cream to your child right before bedtime.  · One treatment is usually effective and will eliminate approximately 95% of infestations. For severe cases, your caregiver may decide to repeat the treatment in 1 week. Everyone in your household should be treated with one application of the cream.  · New rashes or burrows should not appear within 24 to 48 hours after successful treatment. However, the itching and rash may last for 2 to 4 weeks after successful treatment. Your caregiver may prescribe a medicine to help with the itching or to help the rash go away more quickly.  · Scabies can live on clothing or linens for up to 3 days. All of your child's recently used clothing, towels, stuffed toys, and bed linens should be washed in hot water and then dried in a dryer for at least 20 minutes on high heat. Items that cannot be washed should be enclosed in a plastic bag for at least 3 days.  · To help relieve itching, bathe your child in a cool bath or apply cool washcloths to the  affected areas.  · Your child may return to school after treatment with the prescribed cream.  SEEK MEDICAL CARE IF:   · The itching persists longer than 4 weeks after treatment.  · The rash spreads or becomes infected. Signs of infection include red blisters or yellow-tan crust.  Document Released: 04/23/2005 Document Revised: 07/16/2011 Document Reviewed: 09/01/2008  Exit[employer]® Patient Information ©2014 [employer].

## 2013-01-11 NOTE — Progress Notes (Signed)
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  Objective:  Typical scabetiform rash both forearms with 1 mm linear papules  Assessment:  Scabies.    Patient also has been to the cardiologist. He said high blood pressure and cardiologist wants him to have his blood pressure medicine refilled by her primary care doctor. He works in [employer]. Patient is married and has 2 daughters one of which is going to [employer]  Objective: No acute distress, normal weight HEENT: Unremarkable Chest: Clear Heart: Regular Extremities: No edema Skin: See above  Plan:Scabies - Plan: ivermectin (STROMECTOL) 3 MG TABS tablet, predniSONE (DELTASONE) 20 MG tablet  Hypertension - Plan: amLODipine (NORVASC) 10 MG tablet  Signed, Elvina Sidle, MD

## 2013-01-19 ENCOUNTER — Ambulatory Visit (INDEPENDENT_AMBULATORY_CARE_PROVIDER_SITE_OTHER): Payer: Managed Care, Other (non HMO) | Admitting: Pharmacist

## 2013-01-19 DIAGNOSIS — Z7901 Long term (current) use of anticoagulants: Secondary | ICD-10-CM

## 2013-01-19 DIAGNOSIS — D6859 Other primary thrombophilia: Secondary | ICD-10-CM

## 2013-01-19 NOTE — Patient Instructions (Signed)
Patient instructed to take medications as defined in the Anti-coagulation Track section of this encounter.  Patient instructed to take day's dose.  Patient verbalized understanding of these instructions.

## 2013-01-19 NOTE — Progress Notes (Signed)
Anti-Coagulation Progress Note  Ronnie Fowler is a 47 y.o. male who is currently on an anti-coagulation regimen.    RECENT RESULTS: Recent results are below, the most recent result is correlated with a dose of 30 mg. per week: Lab Results  Component Value Date   INR 2.40 01/19/2013   INR 2.40 12/22/2012   INR 2.5 11/03/2012    ANTI-COAG DOSE: Anticoagulation Dose Instructions as of 01/19/2013     [name] Tue Wed Thu Fri Sat   New Dose 5 mg 2.5 mg 5 mg 5 mg 2.5 mg 5 mg 5 mg       ANTICOAG SUMMARY: Anticoagulation Episode Summary   Current INR goal 2.0-3.0  Next INR check 02/16/2013  INR from last check 2.40 (01/19/2013)  Weekly max dose   Target end date Indefinite  [facility]  Preferred lab   Send INR reminders to [name]   Indications  Long-term (current) use of anticoagulants [V58.61] Hypercoagulable state primary [289.81]        Comments       Anticoagulation Care Providers   Provider Role Specialty Phone number   Burns Spain, MD  Internal Medicine [phone]      ANTICOAG TODAY: Anticoagulation Summary as of 01/19/2013   INR goal 2.0-3.0  Selected INR 2.40 (01/19/2013)  Next INR check 02/16/2013  Target end date Indefinite   Indications  Long-term (current) use of anticoagulants [V58.61] Hypercoagulable state primary [289.81]      Anticoagulation Episode Summary   [facility] check location [facility]   Preferred lab    Send INR reminders to [name]   Comments     Anticoagulation Care Providers   Provider Role Specialty Phone number   Burns Spain, MD  Internal Medicine [phone]      PATIENT INSTRUCTIONS: Patient Instructions  Patient instructed to take medications as defined in the Anti-coagulation Track section of this encounter.  Patient instructed to take day's dose.  Patient verbalized understanding of these instructions.       FOLLOW-UP Return in 4 weeks (on 02/16/2013) for Follow up  INR at 4:30PM.  [name], [name], [name]

## 2013-02-16 ENCOUNTER — Ambulatory Visit (INDEPENDENT_AMBULATORY_CARE_PROVIDER_SITE_OTHER): Payer: Managed Care, Other (non HMO) | Admitting: Pharmacist

## 2013-02-16 DIAGNOSIS — Z7901 Long term (current) use of anticoagulants: Secondary | ICD-10-CM

## 2013-02-16 DIAGNOSIS — D6859 Other primary thrombophilia: Secondary | ICD-10-CM

## 2013-02-16 NOTE — Patient Instructions (Signed)
Patient instructed to take medications as defined in the Anti-coagulation Track section of this encounter.  Patient instructed to take today's dose.  Patient verbalized understanding of these instructions.    

## 2013-02-16 NOTE — Progress Notes (Signed)
Indication: Protein C deficiency. Duration: Lifelong. INR: At target. Agree with Dr. Groce's assessment and plan. 

## 2013-02-16 NOTE — Progress Notes (Signed)
Anti-Coagulation Progress Note  Ronnie Fowler is a 47 y.o. male who is currently on an anti-coagulation regimen.    RECENT RESULTS: Recent results are below, the most recent result is correlated with a dose of 30 mg. per week: Lab Results  Component Value Date   INR 2.90 02/16/2013   INR 2.40 01/19/2013   INR 2.40 12/22/2012    ANTI-COAG DOSE: Anticoagulation Dose Instructions as of 02/16/2013     [name] Tue Wed Thu Fri Sat   New Dose 5 mg 2.5 mg 5 mg 5 mg 2.5 mg 5 mg 5 mg       ANTICOAG SUMMARY: Anticoagulation Episode Summary   Current INR goal 2.0-3.0  Next INR check 03/16/2013  INR from last check 2.90 (02/16/2013)  Weekly max dose   Target end date Indefinite  [facility]  Preferred lab   Send INR reminders to [name]   Indications  Long-term (current) use of anticoagulants [V58.61] Hypercoagulable state primary [289.81]        Comments       Anticoagulation Care Providers   Provider Role Specialty Phone number   Burns Spain, MD  Internal Medicine [phone]      ANTICOAG TODAY: Anticoagulation Summary as of 02/16/2013   INR goal 2.0-3.0  Selected INR 2.90 (02/16/2013)  Next INR check 03/16/2013  Target end date Indefinite   Indications  Long-term (current) use of anticoagulants [V58.61] Hypercoagulable state primary [289.81]      Anticoagulation Episode Summary   [facility] check location [facility]   Preferred lab    Send INR reminders to [name]   Comments     Anticoagulation Care Providers   Provider Role Specialty Phone number   Burns Spain, MD  Internal Medicine [phone]      PATIENT INSTRUCTIONS: Patient Instructions  Patient instructed to take medications as defined in the Anti-coagulation Track section of this encounter.  Patient instructed to take today's dose.  Patient verbalized understanding of these instructions.       FOLLOW-UP Return in 4 weeks (on 03/16/2013) for  Follow up INR at 4:15PM.  [name], [name], [name]

## 2013-03-12 ENCOUNTER — Other Ambulatory Visit: Payer: Self-pay

## 2013-03-16 ENCOUNTER — Ambulatory Visit (INDEPENDENT_AMBULATORY_CARE_PROVIDER_SITE_OTHER): Payer: Managed Care, Other (non HMO) | Admitting: Pharmacist

## 2013-03-16 DIAGNOSIS — D6859 Other primary thrombophilia: Secondary | ICD-10-CM

## 2013-03-16 DIAGNOSIS — Z7901 Long term (current) use of anticoagulants: Secondary | ICD-10-CM

## 2013-03-16 NOTE — Progress Notes (Signed)
Anti-Coagulation Progress Note  Ronnie Fowler is a 47 y.o. male who is currently on an anti-coagulation regimen.    RECENT RESULTS: Recent results are below, the most recent result is correlated with a dose of 30 mg. per week: Lab Results  Component Value Date   INR 3.00 03/16/2013   INR 2.90 02/16/2013   INR 2.40 01/19/2013    ANTI-COAG DOSE: Anticoagulation Dose Instructions as of 03/16/2013     [name] Tue Wed Thu Fri Sat   New Dose 5 mg 2.5 mg 5 mg 2.5 mg 5 mg 2.5 mg 5 mg       ANTICOAG SUMMARY: Anticoagulation Episode Summary   Current INR goal 2.0-3.0  Next INR check 04/20/2013  INR from last check 3.00 (03/16/2013)  Weekly max dose   Target end date Indefinite  [facility] check [facility]  Preferred lab   Send INR reminders to [name]   Indications  Long-term (current) use of anticoagulants [V58.61] Hypercoagulable state primary [289.81]        Comments       Anticoagulation Care Providers   Provider Role Specialty Phone number   Burns Spain, MD  Internal Medicine [phone]      ANTICOAG TODAY: Anticoagulation Summary as of 03/16/2013   INR goal 2.0-3.0  Selected INR 3.00 (03/16/2013)  Next INR check 04/20/2013  Target end date Indefinite   Indications  Long-term (current) use of anticoagulants [V58.61] Hypercoagulable state primary [289.81]      Anticoagulation Episode Summary   [facility] check location [facility]   Preferred lab    Send INR reminders to [name]   Comments     Anticoagulation Care Providers   Provider Role Specialty Phone number   Burns Spain, MD  Internal Medicine [phone]      PATIENT INSTRUCTIONS: Patient Instructions  Patient instructed to take medications as defined in the Anti-coagulation Track section of this encounter.  Patient instructed to take today's dose.  Patient verbalized understanding of these instructions.        FOLLOW-UP Return in 5 weeks (on 04/20/2013)  for Follow up INR at 4:30PM.  [name], [name], [name]

## 2013-03-16 NOTE — Patient Instructions (Signed)
Patient instructed to take medications as defined in the Anti-coagulation Track section of this encounter.  Patient instructed to take today's dose.  Patient verbalized understanding of these instructions.    

## 2013-03-17 NOTE — Progress Notes (Signed)
I have reviewed Dr. Saralyn Pilar note.  Ronnie Fowler is on anticoagulation for a hypercoagulable state.

## 2013-03-25 ENCOUNTER — Encounter: Payer: Self-pay | Admitting: Internal Medicine

## 2013-04-20 ENCOUNTER — Ambulatory Visit: Payer: Managed Care, Other (non HMO)

## 2013-04-27 ENCOUNTER — Ambulatory Visit (INDEPENDENT_AMBULATORY_CARE_PROVIDER_SITE_OTHER): Payer: Managed Care, Other (non HMO) | Admitting: Pharmacist

## 2013-04-27 DIAGNOSIS — D6859 Other primary thrombophilia: Secondary | ICD-10-CM

## 2013-04-27 DIAGNOSIS — Z7901 Long term (current) use of anticoagulants: Secondary | ICD-10-CM

## 2013-04-27 LAB — POCT INR: INR: 2.4

## 2013-04-27 NOTE — Patient Instructions (Signed)
Patient instructed to take medications as defined in the Anti-coagulation Track section of this encounter.  Patient instructed to take today's dose.  Patient verbalized understanding of these instructions.    

## 2013-04-27 NOTE — Progress Notes (Signed)
Anti-Coagulation Progress Note  Ronnie Fowler is a 47 y.o. male who is currently on an anti-coagulation regimen.    RECENT RESULTS: Recent results are below, the most recent result is correlated with a dose of 27.5 mg. per week: Lab Results  Component Value Date   INR 2.40 04/27/2013   INR 3.00 03/16/2013   INR 2.90 02/16/2013    ANTI-COAG DOSE: Anticoagulation Dose Instructions as of 04/27/2013     [name] Tue Wed Thu Fri Sat   New Dose 5 mg 2.5 mg 5 mg 2.5 mg 5 mg 2.5 mg 5 mg       ANTICOAG SUMMARY: Anticoagulation Episode Summary   Current INR goal 2.0-3.0  Next INR check 06/01/2013  INR from last check 2.40 (04/27/2013)  Weekly max dose   Target end date Indefinite  [facility]  Preferred lab   Send INR reminders to [name]   Indications  Long-term (current) use of anticoagulants [V58.61] Hypercoagulable state primary [289.81]        Comments       Anticoagulation Care Providers   Provider Role Specialty Phone number   Burns Spain, MD  Internal Medicine [phone]      ANTICOAG TODAY: Anticoagulation Summary as of 04/27/2013   INR goal 2.0-3.0  Selected INR 2.40 (04/27/2013)  Next INR check 06/01/2013  Target end date Indefinite   Indications  Long-term (current) use of anticoagulants [V58.61] Hypercoagulable state primary [289.81]      Anticoagulation Episode Summary   [facility] check [facility]   Preferred lab    Send INR reminders to [name]   Comments     Anticoagulation Care Providers   Provider Role Specialty Phone number   Burns Spain, MD  Internal Medicine [phone]      PATIENT INSTRUCTIONS: Patient Instructions  Patient instructed to take medications as defined in the Anti-coagulation Track section of this encounter.  Patient instructed to take today's dose.  Patient verbalized understanding of these instructions.       FOLLOW-UP Return in 5 weeks (on 06/01/2013) for  Follow up INR at 4PM.  [name], [name], [name]

## 2013-04-28 NOTE — Progress Notes (Signed)
Indication: Protein C deficiency. Duration: Lifelong. INR: At target. Agree with Dr. Groce's assessment and plan. 

## 2013-05-29 ENCOUNTER — Encounter: Payer: Managed Care, Other (non HMO) | Admitting: Internal Medicine

## 2013-06-01 ENCOUNTER — Encounter: Payer: Self-pay | Admitting: Internal Medicine

## 2013-06-01 ENCOUNTER — Ambulatory Visit (INDEPENDENT_AMBULATORY_CARE_PROVIDER_SITE_OTHER): Payer: Managed Care, Other (non HMO) | Admitting: Pharmacist

## 2013-06-01 ENCOUNTER — Ambulatory Visit (INDEPENDENT_AMBULATORY_CARE_PROVIDER_SITE_OTHER): Payer: Managed Care, Other (non HMO) | Admitting: Internal Medicine

## 2013-06-01 VITALS — BP 159/88 | HR 64 | Temp 98.2°F | Ht 68.0 in | Wt 199.6 lb

## 2013-06-01 DIAGNOSIS — Z86718 Personal history of other venous thrombosis and embolism: Secondary | ICD-10-CM

## 2013-06-01 DIAGNOSIS — D689 Coagulation defect, unspecified: Secondary | ICD-10-CM

## 2013-06-01 DIAGNOSIS — D6859 Other primary thrombophilia: Secondary | ICD-10-CM

## 2013-06-01 DIAGNOSIS — Z0000 Encounter for general adult medical examination without abnormal findings: Secondary | ICD-10-CM

## 2013-06-01 DIAGNOSIS — R760 Raised antibody titer: Secondary | ICD-10-CM

## 2013-06-01 DIAGNOSIS — Z7901 Long term (current) use of anticoagulants: Secondary | ICD-10-CM

## 2013-06-01 DIAGNOSIS — I10 Essential (primary) hypertension: Secondary | ICD-10-CM

## 2013-06-01 DIAGNOSIS — Z23 Encounter for immunization: Secondary | ICD-10-CM

## 2013-06-01 LAB — POCT INR: INR: 2.3

## 2013-06-01 MED ORDER — AMLODIPINE BESYLATE 10 MG PO TABS: 10.0000 mg | ORAL_TABLET | Freq: Every day | ORAL | Status: DC

## 2013-06-01 NOTE — Patient Instructions (Signed)
Thank you for your visit.  Today you received the flu shot.  I also refilled your amlodipine prescription.  I did some basic lab work today. I checked your kidney function, electrolytes, cholesterol panel. I will call you with any abnormal results.  Please continue to followup monthly with Dr. gross for INR checks.  If your labs are normal, I'm happy to see you back in one year. If your labs are abnormal and I call you, we will schedule a followup sooner.

## 2013-06-01 NOTE — Progress Notes (Signed)
Patient ID: Ronnie ShepherdSamuel Southwood, male   DOB: [birth year], 48 y.o.   MRN: [id number] HPI The patient is a 48 y.o. male with a history of protein C deficiency, lupus anticoagulant positive, pulmonary embolism, hypertension who presents for routine clinic visit.  HTN: Patient has good control of his blood pressure. He takes amlodipine 10mg  every night and has not taken his amlodipine yet today. His blood pressure slightly elevated in clinic today blood pressure is 159/80. Patient takes his blood pressure at home and it usually reads 139/83. Patient exercises daily on the treadmill he walks for 45 minutes. Patient needs mostly rice based dishes and keeps a low salt diet.  Hypercoaguable state: Patient is on chronic Coumadin therapy and follows with Dr. Alexandria LodgeGroce monthly for INR checks. His INR has been therapeutic. He has not noticed any blood in his stool, nose bleeding, other bleeding. He is scheduled for an appointment with Dr. Alexandria LodgeGroce today.  Patient has no complaints today. He does need a refill of his amlodipine.  ROS: General: no fevers, chills, changes in weight, changes in appetite Skin: no rash HEENT: no blurry vision, hearing changes, sore throat Pulm: no dyspnea, coughing, wheezing CV: no chest pain, palpitations, shortness of breath Abd: no abdominal pain, nausea/vomiting, diarrhea/constipation GU: no dysuria, hematuria, polyuria Ext: no arthralgias, myalgias Neuro: no weakness, numbness, or tingling  Filed Vitals:   06/01/13 1607  BP: 159/88  Pulse: 64  Temp: 98.2 F (36.8 C)   Physical Exam General: alert, cooperative, and in no apparent distress HEENT: pupils equal round and reactive to light, vision grossly intact, oropharynx clear and non-erythematous  Neck: supple Lungs: clear to ascultation bilaterally, normal work of respiration, no wheezes, rales, ronchi Heart: regular rate and rhythm, no murmurs, gallops, or rubs Abdomen: soft, non-tender, non-distended, normal bowel  sounds Extremities: warm extremities b/l, no pedal edema Neurologic: alert & oriented X3, cranial nerves II-XII grossly intact, strength grossly intact, sensation intact to light touch  Current Outpatient Prescriptions on File Prior to Visit  Medication Sig Dispense Refill  . amLODipine (NORVASC) 10 MG tablet Take 1 tablet (10 mg total) by mouth daily.  90 tablet  3  . CLARINEX 5 MG tablet Take 1 tablet by mouth Daily.      . fluticasone (FLONASE) 50 MCG/ACT nasal spray 1 spray by Nasal route Daily.      Marland Kitchen. ivermectin (STROMECTOL) 3 MG TABS tablet Take 1 tablet (3 mg total) by mouth once.  4 tablet  0  . predniSONE (DELTASONE) 20 MG tablet Two daily with food  10 tablet  0  . warfarin (COUMADIN) 5 MG tablet Take as directed by anticoagulation clinic provider.  90 tablet  3   No current facility-administered medications on file prior to visit.    Assessment/Plan

## 2013-06-01 NOTE — Assessment & Plan Note (Signed)
Blood pressure is well-controlled overall. His blood pressure is slightly elevated today though I think this is because he has not yet taken his amlodipine today. Blood pressure at home seems to run lower than it is in clinic today. I will continue his amlodipine 10 mg daily. Check a BMP today if these labs return normal patient can followup with me in one year.

## 2013-06-01 NOTE — Assessment & Plan Note (Signed)
Patient received a flu shot today. I also checked a lipid panel as patient has not had a lipid panel done since 2009.

## 2013-06-01 NOTE — Assessment & Plan Note (Addendum)
Patient on chronic Coumadin therapy for protein C deficiency as well as prior PE and lupus anticoagulant positive. He is compliant with Coumadin. He reports no bleeding. He follows with Dr. Alexandria Lodgegroce monthly for INR checks and his INR has been therapeutic. He'll see Dr. Alexandria LodgeGroce today after this visit.

## 2013-06-02 LAB — BASIC METABOLIC PANEL WITH GFR
BUN: 12 mg/dL (ref 6–23)
CALCIUM: 9.5 mg/dL (ref 8.4–10.5)
CO2: 25 mEq/L (ref 19–32)
Chloride: 105 mEq/L (ref 96–112)
Creat: 0.99 mg/dL (ref 0.50–1.35)
GFR, Est African American: >89 mL/min
GLUCOSE: 94 mg/dL (ref 70–99)
Potassium: 3.6 mEq/L (ref 3.5–5.3)
SODIUM: 136 meq/L (ref 135–145)

## 2013-06-02 LAB — LIPID PANEL
Cholesterol: 159 mg/dL (ref 0–200)
HDL: 50 mg/dL (ref >39)
LDL CALC: 96 mg/dL (ref 0–99)
Total CHOL/HDL Ratio: 3.2 Ratio
Triglycerides: 64 mg/dL (ref <150)
VLDL: 13 mg/dL (ref 0–40)

## 2013-06-03 NOTE — Progress Notes (Signed)
Case discussed with Dr. Chikowski at the time of the visit.  We reviewed the resident's history and exam and pertinent patient test results.  I agree with the assessment, diagnosis, and plan of care documented in the resident's note. 

## 2013-06-03 NOTE — Patient Instructions (Signed)
Patient instructed to take medications as defined in the Anti-coagulation Track section of this encounter.  Patient instructed to take today's dose.  Patient verbalized understanding of these instructions.    

## 2013-06-03 NOTE — Progress Notes (Signed)
  Indication: hypercoagulable state.  Duration: indefinite.  INR: 2-3 target.  Agree with Dr. Saralyn PilarGroce's assessment and plan.

## 2013-06-03 NOTE — Progress Notes (Signed)
Anti-Coagulation Progress Note  Ronnie ShepherdSamuel Fowler is a 48 y.o. male who is currently on an anti-coagulation regimen.    RECENT RESULTS: Recent results are below, the most recent result is correlated with a dose of 27.5 mg. per week: Lab Results  Component Value Date   INR 2.30 06/01/2013   INR 2.40 04/27/2013   INR 3.00 03/16/2013    ANTI-COAG DOSE: Anticoagulation Dose Instructions as of 06/01/2013     [name]   New Dose 5 mg 2.5 mg 5 mg 2.5 mg 5 mg 2.5 mg 5 mg       ANTICOAG SUMMARY: Anticoagulation Episode Summary   Current INR goal 2.0-3.0  Next INR check 06/29/2013  INR from last check 2.30 (06/01/2013)  Weekly max dose   Target end date Indefinite  [facility]  Preferred lab   Send INR reminders to [name]   Indications  Long-term (current) use of anticoagulants [V58.61] Hypercoagulable state primary [289.81]        Comments       Anticoagulation Care Providers   Provider Role Specialty Phone number   Burns SpainElizabeth A Butcher, MD  Internal Medicine [phone]      ANTICOAG TODAY: Anticoagulation Summary as of 06/01/2013   INR goal 2.0-3.0  Selected INR 2.30 (06/01/2013)  Next INR check 06/29/2013  Target end date Indefinite   Indications  Long-term (current) use of anticoagulants [V58.61] Hypercoagulable state primary [289.81]      Anticoagulation Episode Summary   [facility] check [facility]   Preferred lab    Send INR reminders to [name]   Comments     Anticoagulation Care Providers   Provider Role Specialty Phone number   Burns SpainElizabeth A Butcher, MD  Internal Medicine [phone]      PATIENT INSTRUCTIONS: Patient Instructions  Patient instructed to take medications as defined in the Anti-coagulation Track section of this encounter.  Patient instructed to take today's dose.  Patient verbalized understanding of these instructions.       FOLLOW-UP Return in 4 weeks (on 06/29/2013) for  Follow up INR at 4:30PM.  Hulen LusterJames Maxon Kresse, [name], [name]

## 2013-06-15 ENCOUNTER — Other Ambulatory Visit: Payer: Self-pay | Admitting: *Deleted

## 2013-06-15 DIAGNOSIS — D6859 Other primary thrombophilia: Secondary | ICD-10-CM

## 2013-06-15 DIAGNOSIS — Z7901 Long term (current) use of anticoagulants: Secondary | ICD-10-CM

## 2013-06-15 DIAGNOSIS — Z86711 Personal history of pulmonary embolism: Secondary | ICD-10-CM

## 2013-06-16 MED ORDER — WARFARIN SODIUM 5 MG PO TABS: ORAL_TABLET | ORAL | Status: DC

## 2013-06-29 ENCOUNTER — Ambulatory Visit (INDEPENDENT_AMBULATORY_CARE_PROVIDER_SITE_OTHER): Payer: Managed Care, Other (non HMO) | Admitting: Pharmacist

## 2013-06-29 DIAGNOSIS — Z7901 Long term (current) use of anticoagulants: Secondary | ICD-10-CM

## 2013-06-29 DIAGNOSIS — D6859 Other primary thrombophilia: Secondary | ICD-10-CM

## 2013-06-29 LAB — POCT INR: INR: 2.4

## 2013-06-29 NOTE — Progress Notes (Signed)
Anti-Coagulation Progress Note  Ronnie ShepherdSamuel Fowler is a 48 y.o. male who is currently on an anti-coagulation regimen.    RECENT RESULTS: Recent results are below, the most recent result is correlated with a dose of 27.5 mg. per week: Lab Results  Component Value Date   INR 2.40 06/29/2013   INR 2.30 06/01/2013   INR 2.40 04/27/2013    ANTI-COAG DOSE: Anticoagulation Dose Instructions as of 06/29/2013     [name]   New Dose 5 mg 2.5 mg 5 mg 2.5 mg 5 mg 2.5 mg 5 mg       ANTICOAG SUMMARY: Anticoagulation Episode Summary   Current INR goal 2.0-3.0  Next INR check 07/27/2013  INR from last check 2.40 (06/29/2013)  Weekly max dose   Target end date Indefinite  [facility]  Preferred lab   Send INR reminders to [name]   Indications  Long-term (current) use of anticoagulants [V58.61] Hypercoagulable state primary [289.81]        Comments       Anticoagulation Care Providers   Provider Role Specialty Phone number   Burns SpainElizabeth A Butcher, MD  Internal Medicine [phone]      ANTICOAG TODAY: Anticoagulation Summary as of 06/29/2013   INR goal 2.0-3.0  Selected INR 2.40 (06/29/2013)  Next INR check 07/27/2013  Target end date Indefinite   Indications  Long-term (current) use of anticoagulants [V58.61] Hypercoagulable state primary [289.81]      Anticoagulation Episode Summary   [facility]   Preferred lab    Send INR reminders to [name]   Comments     Anticoagulation Care Providers   Provider Role Specialty Phone number   Burns SpainElizabeth A Butcher, MD  Internal Medicine [phone]      PATIENT INSTRUCTIONS: Patient Instructions  Patient instructed to take medications as defined in the Anti-coagulation Track section of this encounter.  Patient instructed to take today's dose.  Patient verbalized understanding of these instructions.       FOLLOW-UP Return in 4 weeks (on 07/27/2013) for  Follow up INR at 4:30PM.  Hulen LusterJames Toula Miyasaki, [name], [name]

## 2013-06-29 NOTE — Patient Instructions (Signed)
Patient instructed to take medications as defined in the Anti-coagulation Track section of this encounter.  Patient instructed to take today's dose.  Patient verbalized understanding of these instructions.    

## 2013-07-27 ENCOUNTER — Ambulatory Visit (INDEPENDENT_AMBULATORY_CARE_PROVIDER_SITE_OTHER): Payer: PRIVATE HEALTH INSURANCE | Admitting: Internal Medicine

## 2013-07-27 ENCOUNTER — Ambulatory Visit (INDEPENDENT_AMBULATORY_CARE_PROVIDER_SITE_OTHER): Payer: Managed Care, Other (non HMO) | Admitting: Pharmacist

## 2013-07-27 VITALS — BP 125/79 | HR 56 | Temp 98.6°F | Resp 16 | Ht 68.0 in | Wt 183.6 lb

## 2013-07-27 DIAGNOSIS — D6859 Other primary thrombophilia: Secondary | ICD-10-CM

## 2013-07-27 DIAGNOSIS — Z7901 Long term (current) use of anticoagulants: Secondary | ICD-10-CM

## 2013-07-27 DIAGNOSIS — R197 Diarrhea, unspecified: Secondary | ICD-10-CM

## 2013-07-27 LAB — POCT INR: INR: 2.4

## 2013-07-27 MED ORDER — DIPHENOXYLATE-ATROPINE 2.5-0.025 MG PO TABS: 1.0000 | ORAL_TABLET | Freq: Four times a day (QID) | ORAL | Status: DC | PRN

## 2013-07-27 NOTE — Patient Instructions (Signed)
Patient instructed to take medications as defined in the Anti-coagulation Track section of this encounter.  Patient instructed to take today's dose.  Patient verbalized understanding of these instructions.    

## 2013-07-27 NOTE — Progress Notes (Signed)
Subjective

## 2013-07-27 NOTE — Progress Notes (Signed)
   Subjective:    Patient ID: Ronnie Fowler, male    DOB: 02-Jul-1965, 48 y.o.   MRN: [id number] This chart was scribed for Ellamae Siaobert Sweden Lesure, MD by Nicholos Johnsenise Iheanachor, Medical Scribe. This patient's care was started at 2:22 PM.  Abdominal Pain Associated symptoms include diarrhea. Pertinent negatives include no fever or vomiting.   HPI Comments: Ronnie Fowler is a 48 y.o. male who presents to the [facility] complaining of abdominal pain w/ diarrhea, onset 1 day ago. Pt states he has not eaten anything since yesterday due to the diarrhea; says he loses his appetite. Also reports some mild cold symptoms but is unsure if related to current diarrhea and abdominal pain. States he went to a party 2 days ago and believes he may have eaten something that upset his stomach. Reports no sick contacts. Denies fever, emesis.  Review of Systems  Constitutional: Negative for fever.  Gastrointestinal: Positive for abdominal pain and diarrhea. Negative for vomiting.   Objective:   Physical Exam  Vitals reviewed. Constitutional: He is oriented to person, place, and time. He appears well-developed and well-nourished. No distress.  HENT:  Head: Normocephalic and atraumatic.  Eyes: EOM are normal.  Neck: Neck supple.  Cardiovascular: Normal rate.   Pulmonary/Chest: Effort normal. No respiratory distress.  Musculoskeletal: Normal range of motion.  Lymphadenopathy:    He has no cervical adenopathy.  Neurological: He is alert and oriented to person, place, and time.  Skin: Skin is warm and dry.  Psychiatric: He has a normal mood and affect. His behavior is normal.   Assessment & Plan:  Diarrhea  Meds ordered this encounter  Medications  . loperamide (IMODIUM) 1 MG/5ML solution    Sig: Take by mouth as needed for diarrhea or loose stools.  Soft diet and advance  I have completed the patient encounter in its entirety as documented by the scribe, with editing by me where  necessary. Ashmi Blas P. Merla Richesoolittle, M.D.

## 2013-07-27 NOTE — Progress Notes (Signed)
Anti-Coagulation Progress Note  Ronnie ShepherdSamuel Fowler is a 48 y.o. male who is currently on an anti-coagulation regimen.    RECENT RESULTS: Recent results are below, the most recent result is correlated with a dose of 27.5 mg. per week: Lab Results  Component Value Date   INR 2.40 07/27/2013   INR 2.40 06/29/2013   INR 2.30 06/01/2013    ANTI-COAG DOSE: Anticoagulation Dose Instructions as of 07/27/2013     [name]   New Dose 5 mg 2.5 mg 5 mg 2.5 mg 5 mg 2.5 mg 5 mg       ANTICOAG SUMMARY: Anticoagulation Episode Summary   Current INR goal 2.0-3.0  Next INR check 08/24/2013  INR from last check 2.40 (07/27/2013)  Weekly max dose   Target end date Indefinite  [facility]  Preferred lab   Send INR reminders to [name]   Indications  Long-term (current) use of anticoagulants [V58.61] Hypercoagulable state primary [289.81]        Comments       Anticoagulation Care Providers   Provider Role Specialty Phone number   Burns SpainElizabeth A Butcher, MD  Internal Medicine [phone]      ANTICOAG TODAY: Anticoagulation Summary as of 07/27/2013   INR goal 2.0-3.0  Selected INR 2.40 (07/27/2013)  Next INR check 08/24/2013  Target end date Indefinite   Indications  Long-term (current) use of anticoagulants [V58.61] Hypercoagulable state primary [289.81]      Anticoagulation Episode Summary   [facility] check location [facility]   Preferred lab    Send INR reminders to [name]   Comments     Anticoagulation Care Providers   Provider Role Specialty Phone number   Burns SpainElizabeth A Butcher, MD  Internal Medicine [phone]      PATIENT INSTRUCTIONS: Patient Instructions  Patient instructed to take medications as defined in the Anti-coagulation Track section of this encounter.  Patient instructed to take today's dose.  Patient verbalized understanding of these instructions.       FOLLOW-UP Return in 4 weeks (on 08/24/2013) for Follow  up INR at 3:45pm.  Hulen LusterJames Khyren Hing, [name], [name]

## 2013-08-24 ENCOUNTER — Ambulatory Visit: Payer: Managed Care, Other (non HMO)

## 2013-08-31 ENCOUNTER — Ambulatory Visit (INDEPENDENT_AMBULATORY_CARE_PROVIDER_SITE_OTHER): Payer: Managed Care, Other (non HMO) | Admitting: Pharmacist

## 2013-08-31 DIAGNOSIS — D6859 Other primary thrombophilia: Secondary | ICD-10-CM

## 2013-08-31 DIAGNOSIS — Z7901 Long term (current) use of anticoagulants: Secondary | ICD-10-CM

## 2013-08-31 LAB — POCT INR: INR: 2.3

## 2013-08-31 NOTE — Patient Instructions (Signed)
Patient instructed to take medications as defined in the Anti-coagulation Track section of this encounter.  Patient instructed to take today's dose.  Patient verbalized understanding of these instructions.    

## 2013-08-31 NOTE — Progress Notes (Signed)
Anti-Coagulation Progress Note  Ronnie ShepherdSamuel Fowler is a 48 y.o. male who is currently on an anti-coagulation regimen.    RECENT RESULTS: Recent results are below, the most recent result is correlated with a dose of 27.5 mg. per week: Lab Results  Component Value Date   INR 2.30 08/31/2013   INR 2.40 07/27/2013   INR 2.40 06/29/2013    ANTI-COAG DOSE: Anticoagulation Dose Instructions as of 08/31/2013     [name]   New Dose 5 mg 2.5 mg 5 mg 2.5 mg 5 mg 2.5 mg 5 mg       ANTICOAG SUMMARY: Anticoagulation Episode Summary   Current INR goal 2.0-3.0  Next INR check 10/05/2013  INR from last check 2.30 (08/31/2013)  Weekly max dose   Target end date Indefinite  [facility] location [facility]  Preferred lab   Send INR reminders to [name]   Indications  Long-term (current) use of anticoagulants [V58.61] Hypercoagulable state primary [289.81]        Comments       Anticoagulation Care Providers   Provider Role Specialty Phone number   Burns SpainElizabeth A Butcher, MD  Internal Medicine [phone]      ANTICOAG TODAY: Anticoagulation Summary as of 08/31/2013   INR goal 2.0-3.0  Selected INR 2.30 (08/31/2013)  Next INR check 10/05/2013  Target end date Indefinite   Indications  Long-term (current) use of anticoagulants [V58.61] Hypercoagulable state primary [289.81]      Anticoagulation Episode Summary   [facility] check location [facility]   Preferred lab    Send INR reminders to [name]   Comments     Anticoagulation Care Providers   Provider Role Specialty Phone number   Burns SpainElizabeth A Butcher, MD  Internal Medicine [phone]      PATIENT INSTRUCTIONS: Patient Instructions  Patient instructed to take medications as defined in the Anti-coagulation Track section of this encounter.  Patient instructed to take today's dose.  Patient verbalized understanding of these instructions.       FOLLOW-UP Return in 5 weeks (on 10/05/2013) for Follow up  INR at 4:30PM.  Ronnie LusterJames Abygail Galeno, [name], [name]

## 2013-09-01 NOTE — Progress Notes (Signed)
INTERNAL MEDICINE TEACHING ATTENDING ADDENDUM - Aubre Quincy M.D  Duration- indefinite, Indication- primary hypercoaguable state, INR- 2.3. Agree with Dr. Saralyn PilarGroce's recommendations as outlined in his note.

## 2013-10-05 ENCOUNTER — Ambulatory Visit (INDEPENDENT_AMBULATORY_CARE_PROVIDER_SITE_OTHER): Payer: Managed Care, Other (non HMO) | Admitting: Pharmacist

## 2013-10-05 DIAGNOSIS — Z7901 Long term (current) use of anticoagulants: Secondary | ICD-10-CM

## 2013-10-05 DIAGNOSIS — D6859 Other primary thrombophilia: Secondary | ICD-10-CM

## 2013-10-05 DIAGNOSIS — Z86711 Personal history of pulmonary embolism: Secondary | ICD-10-CM

## 2013-10-05 LAB — POCT INR: INR: 2

## 2013-10-05 NOTE — Progress Notes (Addendum)
Anti-Coagulation Progress Note  Adnaan Mowad is a 48 y.o. male who reports to the clinic for monitoring of anticoagulation treatment.    RECENT RESULTS: Recent results are below, the most recent result is correlated with a dose of 27.5 mg. per week (no changes): Lab Results  Component Value Date   INR 2.0 10/05/2013   INR 2.30 08/31/2013   INR 2.40 07/27/2013    ANTI-COAG DOSE: Anticoagulation Dose Instructions as of 10/05/2013     [name] Tue Wed Thu Fri Sat   New Dose 5 mg 2.5 mg 5 mg 2.5 mg 5 mg 2.5 mg 5 mg       ANTICOAG SUMMARY: Anticoagulation Episode Summary   Current INR goal 2.0-3.0  Next INR check 11/09/2013  INR from last check 2.0 (10/05/2013)  Weekly max dose   Target end date Indefinite  [facility] location [facility]  Preferred lab   Send INR reminders to [name]   Indications  Long-term (current) use of anticoagulants [V58.61] Hypercoagulable state primary [289.81]        Comments       Anticoagulation Care Providers   Provider Role Specialty Phone number   Burns Spain, MD  Internal Medicine [phone]      ANTICOAG TODAY: Anticoagulation Summary as of 10/05/2013   INR goal 2.0-3.0  Selected INR 2.0 (10/05/2013)  Next INR check 11/09/2013  Target end date Indefinite   Indications  Long-term (current) use of anticoagulants [V58.61] Hypercoagulable state primary [289.81]      Anticoagulation Episode Summary   [facility] check location [facility]   Preferred lab    Send INR reminders to [name]   Comments     Anticoagulation Care Providers   Provider Role Specialty Phone number   Burns Spain, MD  Internal Medicine [phone]      PATIENT INSTRUCTIONS: Patient Instructions  Patient instructed to take medications as defined in the Anti-coagulation Track section of this encounter.  Patient instructed to take today's dose.  Patient verbalized understanding of these instructions.       FOLLOW-UP Return in about 5  weeks (around 11/09/2013) for Follow up INR at 1630.  Marzetta Board, PharmD BCPS, BCACP

## 2013-10-05 NOTE — Patient Instructions (Signed)
Patient instructed to take medications as defined in the Anti-coagulation Track section of this encounter.  Patient instructed to take today's dose.  Patient verbalized understanding of these instructions.    

## 2013-11-01 ENCOUNTER — Ambulatory Visit (INDEPENDENT_AMBULATORY_CARE_PROVIDER_SITE_OTHER): Payer: PRIVATE HEALTH INSURANCE

## 2013-11-01 ENCOUNTER — Ambulatory Visit (INDEPENDENT_AMBULATORY_CARE_PROVIDER_SITE_OTHER): Payer: PRIVATE HEALTH INSURANCE | Admitting: Internal Medicine

## 2013-11-01 VITALS — BP 151/81 | HR 66 | Temp 98.0°F | Resp 16 | Ht 68.0 in | Wt 188.8 lb

## 2013-11-01 DIAGNOSIS — M25512 Pain in left shoulder: Secondary | ICD-10-CM

## 2013-11-01 DIAGNOSIS — I10 Essential (primary) hypertension: Secondary | ICD-10-CM

## 2013-11-01 DIAGNOSIS — M542 Cervicalgia: Secondary | ICD-10-CM

## 2013-11-01 DIAGNOSIS — M25519 Pain in unspecified shoulder: Secondary | ICD-10-CM

## 2013-11-01 MED ORDER — CYCLOBENZAPRINE HCL 10 MG PO TABS
10.0000 mg | ORAL_TABLET | Freq: Every day | ORAL | Status: DC
Start: 2013-11-01 — End: 2014-08-13

## 2013-11-01 MED ORDER — PREDNISONE 20 MG PO TABS: ORAL_TABLET | ORAL | Status: DC

## 2013-11-01 MED ORDER — AMLODIPINE BESYLATE 10 MG PO TABS: 10.0000 mg | ORAL_TABLET | Freq: Every day | ORAL | Status: DC

## 2013-11-01 NOTE — Progress Notes (Signed)
Subjective:    Patient ID: Ronnie Fowler, male    DOB: 18-Apr-1966, 48 y.o.   MRN: [id number]  HPI  Chief Complaint  Patient presents with  . Neck Pain    x2 weeks, no relief w/ OTC meds  . Shoulder Pain    Bilateral shoulder pain- x2 wks. 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 Preventative health care 06/01/2013  . HTN (hypertension)   . Lupus anticoagulant positive   . Left shoulder pain 08/30/2010  . LUQ abdominal pain 08/30/2010  . Long-term (current) use of anticoagulants 05/23/2010  . Hypercoagulable state, primary 05/23/2010  . URINARY TRACT INFECTION, ACUTE, RECURRENT 03/16/2009  . BACK PAIN, LUMBAR 04/05/2008  . KNEE PAIN, CHRONIC 12/05/2006  . PROTEIN C DEFICIENCY 04/05/2006  . PULMONARY EMBOLISM, HX OF 04/05/2006   Prior to Admission medications   Medication Sig Start Date End Date Taking? Authorizing Provider  amLODipine (NORVASC) 10 MG tablet Take 1 tablet (10 mg total) by mouth daily. 11/01/13  Yes Tonye Pearsonobert P Mareli Antunes, MD  warfarin (COUMADIN) 5 MG tablet Take as directed by anticoagulation clinic provider. 06/15/13 06/15/14 Yes Windell Hummingbirdachel Chikowski, MD  calcium carbonate (OS-CAL) 1250 MG chewable tablet Chew 1 tablet by mouth daily.    Historical Provider, MD  CLARINEX 5 MG  tablet Take 1 tablet by mouth Daily. 07/20/10   Historical Provider, MD    Review of Systems No fever chills night sweats No weight loss No chest pain or palpitations No edema  No shortness of breath    Objective:   Physical Exam BP 151/81  Pulse 66  Temp(Src) 98 F (36.7 C) (Oral)  Resp 16  Ht 5\' 8"  (1.727 m)  Wt 188 lb 12.8 oz (85.639 kg)  BMI 28.71 kg/m2  SpO2 98%    [facility] reading (PRIMARY) by  Dr. Merla Richesoolittle= there is loss of normal lordosis of the C-spine with degenerative changes at C5 and small anterior calcified fragment at the superior margin/the right shoulder appears normal      Assessment & Plan:  Pain in joint, shoulder region, left - Plan:prednisone/shoulder exercises/recheck if not resolved  Neck pain - Plan: Exercise program given  Flexeril at bedtime  Prednisone taper as he cannot take NSAIDs due to his use of Coumadin  Recheck to 4 weeks and to physical therapy if not responding  Essential hypertension - Plan: amLODipine (NORVASC) 10 MG tablet  He is cautioned to only change the medicine as prescribed if he is following his blood pressure with home measurements  on a daily basis  Followup 6 months for labs  Meds ordered this encounter  Medications  . amLODipine (NORVASC) 10 MG tablet    Sig:  Take 1 tablet (10 mg total) by mouth daily.    Dispense:  90 tablet    Refill:  1  . cyclobenzaprine (FLEXERIL) 10 MG tablet    Sig: Take 1 tablet (10 mg total) by mouth at bedtime. For muscle spasm    Dispense:  30 tablet    Refill:  0  . predniSONE (DELTASONE) 20 MG tablet    Sig: 3/3/2/2/1/1 single daily dose for 6 days for neck pain    Dispense:  12 tablet    Refill:  0

## 2013-11-01 NOTE — Patient Instructions (Signed)
Shoulder Exercises EXERCISES  RANGE OF MOTION (ROM) AND STRETCHING EXERCISES These exercises may help you when beginning to rehabilitate your injury. Your symptoms may resolve with or without further involvement from your physician, physical therapist or athletic trainer. While completing these exercises, remember:   Restoring tissue flexibility helps normal motion to return to the joints. This allows healthier, less painful movement and activity.  An effective stretch should be held for at least 30 seconds.  A stretch should never be painful. You should only feel a gentle lengthening or release in the stretched tissue. ROM - Pendulum  Bend at the waist so that your right / left arm falls away from your body. Support yourself with your opposite hand on a solid surface, such as a table or a countertop.  Your right / left arm should be perpendicular to the ground. If it is not perpendicular, you need to lean over farther. Relax the muscles in your right / left arm and shoulder as much as possible.  Gently sway your hips and trunk so they move your right / left arm without any use of your right / left shoulder muscles.  Progress your movements so that your right / left arm moves side to side, then forward and backward, and finally, both clockwise and counterclockwise.  Complete __________ repetitions in each direction. Many people use this exercise to relieve discomfort in their shoulder as well as to gain range of motion. Repeat __________ times. Complete this exercise __________ times per day. STRETCH - Flexion, Standing  Stand with good posture. With an underhand grip on your right / left hand and an overhand grip on the opposite hand, grasp a broomstick or cane so that your hands are a little more than shoulder-width apart.  Keeping your right / left elbow straight and shoulder muscles relaxed, push the stick with your opposite hand to raise your right / left arm in front of your body and  then overhead. Raise your arm until you feel a stretch in your right / left shoulder, but before you have increased shoulder pain.  Try to avoid shrugging your right / left shoulder as your arm rises by keeping your shoulder blade tucked down and toward your mid-back spine. Hold __________ seconds.  Slowly return to the starting position. Repeat __________ times. Complete this exercise __________ times per day. STRETCH - Internal Rotation  Place your right / left hand behind your back, palm-up.  Throw a towel or belt over your opposite shoulder. Grasp the towel/belt with your right / left hand.  While keeping an upright posture, gently pull up on the towel/belt until you feel a stretch in the front of your right / left shoulder.  Avoid shrugging your right / left shoulder as your arm rises by keeping your shoulder blade tucked down and toward your mid-back spine.  Hold __________. Release the stretch by lowering your opposite hand. Repeat __________ times. Complete this exercise __________ times per day. STRETCH - External Rotation and Abduction  Stagger your stance through a doorframe. It does not matter which foot is forward.  As instructed by your physician, physical therapist or athletic trainer, place your hands:  And forearms above your head and on the door frame.  And forearms at head-height and on the door frame.  At elbow-height and on the door frame.  Keeping your head and chest upright and your stomach muscles tight to prevent over-extending your low-back, slowly shift your weight onto your front foot until you feel   a stretch across your chest and/or in the front of your shoulders.  Hold __________ seconds. Shift your weight to your back foot to release the stretch. Repeat __________ times. Complete this stretch __________ times per day.  S

## 2013-11-01 NOTE — Progress Notes (Signed)
   Subjective:    Patient ID: Ronnie ShepherdSamuel Fowler, male    DOB: [birth year], 48 y.o.   MRN: [id number]  HPI    Review of Systems     Objective:   Physical Exam        Assessment & Plan:

## 2013-11-09 ENCOUNTER — Ambulatory Visit (INDEPENDENT_AMBULATORY_CARE_PROVIDER_SITE_OTHER): Payer: Managed Care, Other (non HMO) | Admitting: Pharmacist

## 2013-11-09 DIAGNOSIS — Z7901 Long term (current) use of anticoagulants: Secondary | ICD-10-CM

## 2013-11-09 DIAGNOSIS — D6859 Other primary thrombophilia: Secondary | ICD-10-CM

## 2013-11-09 LAB — POCT INR: INR: 2.7

## 2013-11-09 NOTE — Patient Instructions (Signed)
Patient instructed to take medications as defined in the Anti-coagulation Track section of this encounter.  Patient instructed to take today's dose.  Patient verbalized understanding of these instructions.    

## 2013-11-09 NOTE — Progress Notes (Signed)
Anti-Coagulation Progress Note  Ronnie ShepherdSamuel Fowler is a 48 y.o. male who is currently on an anti-coagulation regimen.    RECENT RESULTS: Recent results are below, the most recent result is correlated with a dose of 27.5 mg. per week: Lab Results  Component Value Date   INR 2.70 11/09/2013   INR 2.0 10/05/2013   INR 2.30 08/31/2013    ANTI-COAG DOSE: Anticoagulation Dose Instructions as of 11/09/2013     [name]   New Dose 5 mg 2.5 mg 5 mg 2.5 mg 5 mg 2.5 mg 5 mg       ANTICOAG SUMMARY: Anticoagulation Episode Summary   Current INR goal 2.0-3.0  Next INR check 12/14/2013  INR from last check 2.70 (11/09/2013)  Weekly max dose   Target end date Indefinite  [facility]  Preferred lab   Send INR reminders to [name]   Indications  Long-term (current) use of anticoagulants [V58.61] Hypercoagulable state primary [289.81]        Comments       Anticoagulation Care Providers   Provider Role Specialty Phone number   Burns SpainElizabeth A Butcher, MD  Internal Medicine [phone]      ANTICOAG TODAY: Anticoagulation Summary as of 11/09/2013   INR goal 2.0-3.0  Selected INR 2.70 (11/09/2013)  Next INR check 12/14/2013  Target end date Indefinite   Indications  Long-term (current) use of anticoagulants [V58.61] Hypercoagulable state primary [289.81]      Anticoagulation Episode Summary   [facility]   Preferred lab    Send INR reminders to [name]   Comments     Anticoagulation Care Providers   Provider Role Specialty Phone number   Burns SpainElizabeth A Butcher, MD  Internal Medicine [phone]      PATIENT INSTRUCTIONS: Patient Instructions  Patient instructed to take medications as defined in the Anti-coagulation Track section of this encounter.  Patient instructed to take today's dose.  Patient verbalized understanding of these instructions.       FOLLOW-UP Return in 5 weeks (on 12/14/2013) for Follow up INR  at 4:15pm.  Hulen LusterJames Lalaine Overstreet, [name], [name]

## 2013-12-14 ENCOUNTER — Ambulatory Visit (INDEPENDENT_AMBULATORY_CARE_PROVIDER_SITE_OTHER): Payer: Managed Care, Other (non HMO) | Admitting: Pharmacist

## 2013-12-14 DIAGNOSIS — D6859 Other primary thrombophilia: Secondary | ICD-10-CM

## 2013-12-14 DIAGNOSIS — Z7901 Long term (current) use of anticoagulants: Secondary | ICD-10-CM

## 2013-12-14 LAB — POCT INR: INR: 1.9

## 2013-12-14 NOTE — Patient Instructions (Signed)
Patient instructed to take medications as defined in the Anti-coagulation Track section of this encounter.  Patient instructed to take today's dose.  Patient verbalized understanding of these instructions.    

## 2013-12-14 NOTE — Progress Notes (Signed)
Anti-Coagulation Progress Note  Jerel ShepherdSamuel Hardie is a 48 y.o. male who is currently on an anti-coagulation regimen.    RECENT RESULTS: Recent results are below, the most recent result is correlated with a dose of 27.5 mg. per week: Lab Results  Component Value Date   INR 1.90 12/14/2013   INR 2.70 11/09/2013   INR 2.0 10/05/2013    ANTI-COAG DOSE: Anticoagulation Dose Instructions as of 12/14/2013     [name]   New Dose 5 mg 5 mg 5 mg 2.5 mg 5 mg 5 mg 2.5 mg       ANTICOAG SUMMARY: Anticoagulation Episode Summary   Current INR goal 2.0-3.0  Next INR check 01/04/2014  INR from last check 1.90! (12/14/2013)  Weekly max dose   Target end date Indefinite  [facility]  Preferred lab   Send INR reminders to [name]   Indications  Long-term (current) use of anticoagulants [V58.61] Hypercoagulable state primary [289.81]        Comments       Anticoagulation Care Providers   Provider Role Specialty Phone number   Burns SpainElizabeth A Butcher, MD  Internal Medicine [phone]      ANTICOAG TODAY: Anticoagulation Summary as of 12/14/2013   INR goal 2.0-3.0  Selected INR 1.90! (12/14/2013)  Next INR check 01/04/2014  Target end date Indefinite   Indications  Long-term (current) use of anticoagulants [V58.61] Hypercoagulable state primary [289.81]      Anticoagulation Episode Summary   [facility]   Preferred lab    Send INR reminders to [name]   Comments     Anticoagulation Care Providers   Provider Role Specialty Phone number   Burns SpainElizabeth A Butcher, MD  Internal Medicine [phone]      PATIENT INSTRUCTIONS: Patient Instructions  Patient instructed to take medications as defined in the Anti-coagulation Track section of this encounter.  Patient instructed to take today's dose.  Patient verbalized understanding of these instructions.       FOLLOW-UP Return in 3 weeks (on 01/04/2014) for Follow up  INR at 4:30PM.  Hulen LusterJames Cerita Rabelo, [name], [name]

## 2013-12-22 ENCOUNTER — Telehealth: Payer: Self-pay | Admitting: *Deleted

## 2013-12-22 NOTE — Telephone Encounter (Signed)
Pt walked in to clinic  4:05PM - has noted dark color in urine today. Pt denies any freq or discomfort with urination. Front desk pool paged Dr Alexandria LodgeGroce x 2 with no response. Talked with Dr Heide SparkNarendra - suggest to hold Coumadin this PM and to see early AM and do INR. Appt sch with Dr Delane GingerGill 12/23/13 8:15AM. If discoloration gets heavier and turns bright red - go to ER. Pt states he understands. Stanton KidneyDebra Sem Mccaughey RN 12/22/13 4:15PM

## 2013-12-23 ENCOUNTER — Encounter: Payer: Self-pay | Admitting: Internal Medicine

## 2013-12-23 ENCOUNTER — Ambulatory Visit (INDEPENDENT_AMBULATORY_CARE_PROVIDER_SITE_OTHER): Payer: Managed Care, Other (non HMO) | Admitting: Internal Medicine

## 2013-12-23 VITALS — BP 150/92 | HR 62 | Temp 97.8°F | Ht 68.0 in | Wt 193.7 lb

## 2013-12-23 DIAGNOSIS — R310 Gross hematuria: Secondary | ICD-10-CM

## 2013-12-23 DIAGNOSIS — Z111 Encounter for screening for respiratory tuberculosis: Secondary | ICD-10-CM

## 2013-12-23 DIAGNOSIS — R319 Hematuria, unspecified: Secondary | ICD-10-CM

## 2013-12-23 LAB — COMPLETE METABOLIC PANEL WITH GFR
ALT: 19 U/L (ref 0–53)
AST: 22 U/L (ref 0–37)
Albumin: 4.3 g/dL (ref 3.5–5.2)
Alkaline Phosphatase: 56 U/L (ref 39–117)
BILIRUBIN TOTAL: 1.2 mg/dL (ref 0.2–1.2)
BUN: 11 mg/dL (ref 6–23)
CALCIUM: 9 mg/dL (ref 8.4–10.5)
CHLORIDE: 105 meq/L (ref 96–112)
CO2: 27 meq/L (ref 19–32)
Creat: 0.96 mg/dL (ref 0.50–1.35)
GLUCOSE: 98 mg/dL (ref 70–99)
Potassium: 3.9 mEq/L (ref 3.5–5.3)
SODIUM: 140 meq/L (ref 135–145)
TOTAL PROTEIN: 7.7 g/dL (ref 6.0–8.3)

## 2013-12-23 LAB — URINALYSIS, ROUTINE W REFLEX MICROSCOPIC
Bilirubin Urine: NEGATIVE
Glucose, UA: NEGATIVE mg/dL
KETONES UR: NEGATIVE mg/dL
Leukocytes, UA: NEGATIVE
Nitrite: NEGATIVE
Protein, ur: 30 mg/dL — AB
SPECIFIC GRAVITY, URINE: 1.015 (ref 1.005–1.030)
UROBILINOGEN UA: 0.2 mg/dL (ref 0.0–1.0)
pH: 8.5 — ABNORMAL HIGH (ref 5.0–8.0)

## 2013-12-23 LAB — CBC
HCT: 42.4 % (ref 39.0–52.0)
HEMOGLOBIN: 14.8 g/dL (ref 13.0–17.0)
MCH: 29.1 pg (ref 26.0–34.0)
MCHC: 34.9 g/dL (ref 30.0–36.0)
MCV: 83.5 fL (ref 78.0–100.0)
PLATELETS: 192 10*3/uL (ref 150–400)
RBC: 5.08 MIL/uL (ref 4.22–5.81)
RDW: 14.7 % (ref 11.5–15.5)
WBC: 4.5 10*3/uL (ref 4.0–10.5)

## 2013-12-23 LAB — POCT INR: INR: 2

## 2013-12-23 LAB — URINALYSIS, MICROSCOPIC ONLY

## 2013-12-23 NOTE — Telephone Encounter (Signed)
Pt was seen in clinic this AM - Dr Delane GingerGill.

## 2013-12-23 NOTE — Progress Notes (Signed)
Patient ID: Ronnie Fowler, male   DOB: Jun 24, 1965, 48 y.o.  MRN: [id number]    Subjective:   Patient ID: Ronnie Fowler male    DOB: Jun 24, 1965 48 y.o.    MRN: [id number] Health Maintenance Due: Health Maintenance Due  Topic Date Due  . Tetanus/tdap  10/03/1984  . Influenza Vaccine  12/05/2013    _________________________________________________  HPI: Ronnie Fowler is a 48 y.o. male here for a acute visit for gross hematuria. Pt has a PMH outlined below.  Please see problem-based charting assessment and plan note for further details of medical issues addressed at today's visit.  PMH: Past Medical History  Diagnosis Date  . PE (pulmonary embolism) 03/2002; 05/2005  . Protein C deficiency   . HTN (hypertension)   . Lupus anticoagulant positive     Medications: Current Outpatient Prescriptions on File Prior to Visit  Medication Sig Dispense Refill  . amLODipine (NORVASC) 10 MG tablet Take 1 tablet (10 mg total) by mouth daily.  90 tablet  1  . calcium carbonate (OS-CAL) 1250 MG chewable tablet Chew 1 tablet by mouth daily.      . cyclobenzaprine (FLEXERIL) 10 MG tablet Take 1 tablet (10 mg total) by mouth at bedtime. For muscle spasm  30 tablet  0  . fluticasone (FLONASE) 50 MCG/ACT nasal spray 1 spray by Nasal route Daily.      Marland Kitchen. warfarin (COUMADIN) 5 MG tablet Take as directed by anticoagulation clinic provider.  90 tablet  3   No current facility-administered medications on file prior to visit.    Allergies: No Known Allergies  FH: No family history on file.  SH: History   Social History  . Marital Status: Married    Spouse Name: N/A    Number of Children: N/A  . Years of Education: N/A   Social History Main Topics  . Smoking status: Never Smoker   . Smokeless tobacco: Never Used  . Alcohol Use: No  . Drug Use: No  . Sexual Activity: Not Currently    Partners: Female   Other Topics Concern  . None   Social History Narrative  . None    Review of  Systems: Constitutional: Negative for fever, chills and weight loss.  Eyes: Negative for blurred vision.  Respiratory: Negative for cough and shortness of breath.  Cardiovascular: Negative for chest pain, palpitations and leg swelling.  Gastrointestinal: Negative for nausea, vomiting, abdominal pain, diarrhea, constipation and blood in stool.  Genitourinary: Negative for dysuria, urgency and frequency.  Musculoskeletal: Negative for myalgias and back pain.  Neurological: Negative for dizziness, weakness and headaches.     Objective:   Vital Signs: Filed Vitals:   12/23/13 0825  BP: 150/92  Pulse: 62  Temp: 97.8 F (36.6 C)  TempSrc: Oral  Height: 5\' 8"  (1.727 m)  Weight: 193 lb 11.2 oz (87.862 kg)  SpO2: 100%      BP Readings from Last 3 Encounters:  12/23/13 150/92  11/01/13 151/81  07/27/13 125/79    Physical Exam: Constitutional: Vital signs reviewed.  Patient is well-developed and well-nourished in NAD and cooperative with exam.  Head: Normocephalic and atraumatic. Eyes: PERRL, EOMI, conjunctivae nl, no scleral icterus.  Neck: Supple. Cardiovascular: RRR, no MRG. Pulmonary/Chest: normal effort, non-tender to palpation, CTAB, no wheezes, rales, or rhonchi. Abdominal: Soft. NT/ND +BS. Neurological: A&O x3, cranial nerves II-XII are grossly intact, moving all extremities. Extremities: 2+DP b/l; no pitting edema. Skin: Warm, dry and intact. No rash.  Laboratory Results:   Lab  Results  Component Value Date   INR 2.0 12/23/2013   INR 1.90 12/14/2013   INR 2.70 11/09/2013    Assessment & Plan:   Assessment and plan was discussed and formulated with my attending.

## 2013-12-23 NOTE — Patient Instructions (Signed)
Thank you for your visit today.   Please return to the internal medicine clinic in Friday to have your PPD read.    We are going to do some tests to figure out why you have hematuria or blood in your urine.    Please be sure to bring all of your medications with you to every visit; this includes herbal supplements, vitamins, eye drops, and any over-the-counter medications.   Should you have any questions regarding your medications and/or any new or worsening symptoms, please be sure to call the clinic at [phone].   If you believe that you are suffering from a life threatening condition or one that may result in the loss of limb or function, then you should call 911 or proceed to the nearest Emergency Department.     A healthy lifestyle and preventative care can promote health and wellness.   Maintain regular health, dental, and eye exams.  Eat a healthy diet. Foods like vegetables, fruits, whole grains, low-fat dairy products, and lean protein foods contain the nutrients you need without too many calories. Decrease your intake of foods high in solid fats, added sugars, and salt. Get information about a proper diet from your caregiver, if necessary.  Regular physical exercise is one of the most important things you can do for your health. Most adults should get at least 150 minutes of moderate-intensity exercise (any activity that increases your heart rate and causes you to sweat) each week. In addition, most adults need muscle-strengthening exercises on 2 or more days a week.   Maintain a healthy weight. The body mass index (BMI) is a screening tool to identify possible weight problems. It provides an estimate of body fat based on height and weight. Your caregiver can help determine your BMI, and can help you achieve or maintain a healthy weight. For adults 20 years and older:  A BMI below 18.5 is considered underweight.  A BMI of 18.5 to 24.9 is normal.  A BMI of 25 to 29.9 is  considered overweight.  A BMI of 30 and above is considered obese.

## 2013-12-24 NOTE — Assessment & Plan Note (Signed)
Pt p/w gross hematuria that began yesterday morning.  He is a native of [place].  Denies any trauma, dysuria, cough, hematemesis, melena, hematochezia, abdominal pain, or bright red blood or clots in his urine.  Denies any food substances such as beets or new medications that may turn his urine red.  States he was told to come in if he every experienced hematuria and have his INR checked because he is on chronic coumadin for protein C deficiency.  Reports he has never had any blood in his urine previously.  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  Never a smoker.  Denies any h/o kidney stones.  No FH of similar symptoms.  No h/o kidney problems. On UA, he has large blood and protein without leukocyte esterase or nitrites.  Previously tested neg for TB before coming to the [place]. -place PPD to be read Friday -UA/microscopic -CT Abd/pelvis  -possible referral to urology

## 2013-12-25 LAB — TB SKIN TEST: TB Skin Test: NEGATIVE

## 2013-12-25 NOTE — Progress Notes (Signed)
Case discussed with Dr. Gill at the time of the visit.  We reviewed the resident's history and exam and pertinent patient test results.  I agree with the assessment, diagnosis and plan of care documented in the resident's note. 

## 2014-01-04 ENCOUNTER — Ambulatory Visit (INDEPENDENT_AMBULATORY_CARE_PROVIDER_SITE_OTHER): Payer: Managed Care, Other (non HMO) | Admitting: Pharmacist

## 2014-01-04 DIAGNOSIS — D6859 Other primary thrombophilia: Secondary | ICD-10-CM

## 2014-01-04 DIAGNOSIS — Z7901 Long term (current) use of anticoagulants: Secondary | ICD-10-CM

## 2014-01-04 LAB — POCT INR: INR: 2.1

## 2014-01-04 NOTE — Patient Instructions (Signed)
Patient instructed to take medications as defined in the Anti-coagulation Track section of this encounter.  Patient instructed to take today's dose.  Patient verbalized understanding of these instructions.    

## 2014-01-04 NOTE — Progress Notes (Signed)
Anti-Coagulation Progress Note  Ronnie Fowler is a 48 y.o. male who is currently on an anti-coagulation regimen.    RECENT RESULTS: Recent results are below, the most recent result is correlated with a dose of 30 mg. per week: Lab Results  Component Value Date   INR 2.1 01/04/2014   INR 2.0 12/23/2013   INR 1.90 12/14/2013    ANTI-COAG DOSE: Anticoagulation Dose Instructions as of 01/04/2014     [name] Tue Wed Thu Fri Sat   New Dose 5 mg 5 mg 5 mg 2.5 mg 5 mg 5 mg 2.5 mg       ANTICOAG SUMMARY: Anticoagulation Episode Summary   Current INR goal 2.0-3.0  Next INR check 02/01/2014  INR from last check 2.1 (01/04/2014)  Weekly max dose   Target end date Indefinite  [facility]  Preferred lab   Send INR reminders to [name]   Indications  Long-term (current) use of anticoagulants [V58.61] Hypercoagulable state primary [289.81]        Comments       Anticoagulation Care Providers   Provider Role Specialty Phone number   Burns Spain, MD  Internal Medicine [phone]      ANTICOAG TODAY: Anticoagulation Summary as of 01/04/2014   INR goal 2.0-3.0  Selected INR 2.1 (01/04/2014)  Next INR check 02/01/2014  Target end date Indefinite   Indications  Long-term (current) use of anticoagulants [V58.61] Hypercoagulable state primary [289.81]      Anticoagulation Episode Summary   [facility]   Preferred lab    Send INR reminders to [name]   Comments     Anticoagulation Care Providers   Provider Role Specialty Phone number   Burns Spain, MD  Internal Medicine [phone]      PATIENT INSTRUCTIONS: Patient Instructions  Patient instructed to take medications as defined in the Anti-coagulation Track section of this encounter.  Patient instructed to take today's dose.  Patient verbalized understanding of these instructions.       FOLLOW-UP Return in 4 weeks (on 02/01/2014) for Follow-up INR  in 4 weeks at 4:15pm.  Babs Bertin, PharmD Clinical Pharmacy Resident

## 2014-01-06 NOTE — Progress Notes (Signed)
Indication: Protein C deficiency. Duration: Lifelong. INR: At target. Agree with Ms. Grace Isaac assessment and plan.

## 2014-02-01 ENCOUNTER — Ambulatory Visit (INDEPENDENT_AMBULATORY_CARE_PROVIDER_SITE_OTHER): Payer: Managed Care, Other (non HMO) | Admitting: Pharmacist

## 2014-02-01 DIAGNOSIS — D6859 Other primary thrombophilia: Secondary | ICD-10-CM

## 2014-02-01 DIAGNOSIS — Z7901 Long term (current) use of anticoagulants: Secondary | ICD-10-CM

## 2014-02-01 LAB — POCT INR: INR: 2.8

## 2014-02-01 NOTE — Patient Instructions (Signed)
Patient instructed to take medications as defined in the Anti-coagulation Track section of this encounter.  Patient instructed to take today's dose.  Patient verbalized understanding of these instructions.    

## 2014-02-01 NOTE — Progress Notes (Signed)
Anti-Coagulation Progress Note  Ronnie Fowler is a 48 y.o. male who is currently on an anti-coagulation regimen.    RECENT RESULTS: Recent results are below, the most recent result is correlated with a dose of 30 mg. per week: Lab Results  Component Value Date   INR 2.80 02/01/2014   INR 2.1 01/04/2014   INR 2.0 12/23/2013    ANTI-COAG DOSE: Anticoagulation Dose Instructions as of 02/01/2014     [name] Tue Wed Thu Fri Sat   New Dose 5 mg 2.5 mg 5 mg 2.5 mg 5 mg 5 mg 2.5 mg       ANTICOAG SUMMARY: Anticoagulation Episode Summary   Current INR goal 2.0-3.0  Next INR check 03/01/2014  INR from last check 2.80 (02/01/2014)  Weekly max dose   Target end date Indefinite  [facility]  Preferred lab   Send INR reminders to [name]   Indications  Long-term (current) use of anticoagulants [V58.61] Hypercoagulable state primary [289.81]        Comments       Anticoagulation Care Providers   Provider Role Specialty Phone number   Burns Spain, MD  Internal Medicine [phone]      ANTICOAG TODAY: Anticoagulation Summary as of 02/01/2014   INR goal 2.0-3.0  Selected INR 2.80 (02/01/2014)  Next INR check 03/01/2014  Target end date Indefinite   Indications  Long-term (current) use of anticoagulants [V58.61] Hypercoagulable state primary [289.81]      Anticoagulation Episode Summary   [facility]   Preferred lab    Send INR reminders to [name]   Comments     Anticoagulation Care Providers   Provider Role Specialty Phone number   Burns Spain, MD  Internal Medicine [phone]      PATIENT INSTRUCTIONS: Patient Instructions  Patient instructed to take medications as defined in the Anti-coagulation Track section of this encounter.  Patient instructed to take today's dose.  Patient verbalized understanding of these instructions.       FOLLOW-UP Return in 4 weeks (on 03/01/2014) for Follow  up INR at 4:30PM.  [name], [name], [name]

## 2014-03-01 ENCOUNTER — Ambulatory Visit (INDEPENDENT_AMBULATORY_CARE_PROVIDER_SITE_OTHER): Payer: Managed Care, Other (non HMO) | Admitting: Pharmacist

## 2014-03-01 DIAGNOSIS — D6852 Prothrombin gene mutation: Secondary | ICD-10-CM

## 2014-03-01 DIAGNOSIS — Z7901 Long term (current) use of anticoagulants: Secondary | ICD-10-CM

## 2014-03-01 DIAGNOSIS — D6859 Other primary thrombophilia: Secondary | ICD-10-CM

## 2014-03-01 LAB — POCT INR: INR: 2.1

## 2014-03-01 NOTE — Patient Instructions (Signed)
Patient instructed to take medications as defined in the Anti-coagulation Track section of this encounter.  Patient instructed to take today's dose.  Patient verbalized understanding of these instructions.    

## 2014-03-01 NOTE — Progress Notes (Signed)
Indication: Protein C deficiency. Duration: Lifelong. INR: At target. Agree with Dr. Groce's assessment and plan. 

## 2014-03-01 NOTE — Progress Notes (Signed)
Anti-Coagulation Progress Note  Ronnie ShepherdSamuel Fowler is a 48 y.o. male who is currently on an anti-coagulation regimen.    RECENT RESULTS: Recent results are below, the most recent result is correlated with a dose of 27.5 mg. per week: Lab Results  Component Value Date   INR 2.10 03/01/2014   INR 2.80 02/01/2014   INR 2.1 01/04/2014    ANTI-COAG DOSE: Anticoagulation Dose Instructions as of 03/01/2014     [name]   New Dose 5 mg 5 mg 5 mg 2.5 mg 5 mg 5 mg 2.5 mg       ANTICOAG SUMMARY: Anticoagulation Episode Summary   Current INR goal 2.0-3.0  Next INR check 03/29/2014  INR from last check 2.10 (03/01/2014)  Weekly max dose   Target end date Indefinite  [facility]R check location [facility]  Preferred lab   Send INR reminders to [name]   Indications  Long-term (current) use of anticoagulants [Z79.01] Hypercoagulable state primary [D68.52]        Comments       Anticoagulation Care Providers   Provider Role Specialty Phone number   Burns SpainElizabeth A Butcher, MD  Internal Medicine [phone]      ANTICOAG TODAY: Anticoagulation Summary as of 03/01/2014   INR goal 2.0-3.0  Selected INR 2.10 (03/01/2014)  Next INR check 03/29/2014  Target end date Indefinite   Indications  Long-term (current) use of anticoagulants [Z79.01] Hypercoagulable state primary [D68.52]      Anticoagulation Episode Summary   INR check location [facility]   Preferred lab    Send INR reminders to [name]   Comments     Anticoagulation Care Providers   Provider Role Specialty Phone number   Burns SpainElizabeth A Butcher, MD  Internal Medicine [phone]      PATIENT INSTRUCTIONS: Patient Instructions  Patient instructed to take medications as defined in the Anti-coagulation Track section of this encounter.  Patient instructed to take  today's dose.  Patient verbalized understanding of these instructions.       FOLLOW-UP Return in 4 weeks (on 03/29/2014) for  Follow up INR at 4:15PM.  Hulen LusterJames Sylvanus Telford, [name], [name]

## 2014-03-29 ENCOUNTER — Ambulatory Visit (INDEPENDENT_AMBULATORY_CARE_PROVIDER_SITE_OTHER): Payer: Managed Care, Other (non HMO) | Admitting: Pharmacist

## 2014-03-29 DIAGNOSIS — Z7901 Long term (current) use of anticoagulants: Secondary | ICD-10-CM

## 2014-03-29 DIAGNOSIS — D6859 Other primary thrombophilia: Secondary | ICD-10-CM

## 2014-03-29 DIAGNOSIS — D6852 Prothrombin gene mutation: Secondary | ICD-10-CM

## 2014-03-29 LAB — POCT INR: INR: 2.2

## 2014-03-29 NOTE — Patient Instructions (Signed)
Patient instructed to take medications as defined in the Anti-coagulation Track section of this encounter.  Patient instructed to take today's dose.  Patient verbalized understanding of these instructions.    

## 2014-03-29 NOTE — Progress Notes (Signed)
[name] is a 48 y.o. male who is currently on an anti-coagulation regimen.    RECENT RESULTS: Recent results are below, the most recent result is correlated with a dose of 30 mg. per week: Lab Results  Component Value Date   INR 2.2 03/29/2014   INR 2.10 03/01/2014   INR 2.80 02/01/2014    ANTI-COAG DOSE: Anticoagulation Dose Instructions as of 03/29/2014      [name]   New Dose 5 mg 5 mg 5 mg 2.5 mg 5 mg 5 mg 2.5 mg       ANTICOAG SUMMARY: Anticoagulation Episode Summary    Current INR goal 2.0-3.0  Next INR check 04/26/2014  INR from last check 2.2 (03/29/2014)  Weekly max dose   Target end date Indefinite  [facility] check location [facility]  Preferred lab   Send INR reminders to [name]   Indications  Long-term (current) use of anticoagulants [Z79.01] Hypercoagulable state primary [D68.52]        Comments       Anticoagulation Care Providers    Provider Role Specialty Phone number   Burns SpainElizabeth A Butcher, MD  Internal Medicine [phone]      ANTICOAG TODAY: Anticoagulation Summary as of 03/29/2014    INR goal 2.0-3.0  Selected INR 2.2 (03/29/2014)  Next INR check 04/26/2014  Target end date Indefinite   Indications  Long-term (current) use of anticoagulants [Z79.01] Hypercoagulable state primary [D68.52]      Anticoagulation Episode Summary    [facility]R check location [facility]   Preferred lab    Send INR reminders to [name]   Comments     Anticoagulation Care Providers    Provider Role Specialty Phone number   Burns SpainElizabeth A Butcher, MD  Internal Medicine [phone]      PATIENT INSTRUCTIONS: Patient Instructions  Patient instructed to take medications as defined in the Anti-coagulation Track section of this encounter.  Patient instructed to take today's dose.  Patient verbalized understanding of these instructions.     FOLLOW-UP Return in 4 weeks (on 04/26/2014) for Follow up INR at 4:00pm.  Enzo BiNathan  Aritzel Krusemark, PharmD PGY1 Pharmacy Resident

## 2014-04-26 ENCOUNTER — Ambulatory Visit (INDEPENDENT_AMBULATORY_CARE_PROVIDER_SITE_OTHER): Payer: Managed Care, Other (non HMO) | Admitting: Pharmacist

## 2014-04-26 DIAGNOSIS — Z7901 Long term (current) use of anticoagulants: Secondary | ICD-10-CM

## 2014-04-26 DIAGNOSIS — D6859 Other primary thrombophilia: Secondary | ICD-10-CM

## 2014-04-26 DIAGNOSIS — D6852 Prothrombin gene mutation: Secondary | ICD-10-CM

## 2014-04-26 LAB — POCT INR: INR: 2.4

## 2014-04-26 NOTE — Patient Instructions (Signed)
Patient instructed to take medications as defined in the Anti-coagulation Track section of this encounter.  Patient instructed to take today's dose.  Patient verbalized understanding of these instructions.    

## 2014-04-26 NOTE — Progress Notes (Signed)
Anti-Coagulation Progress Note  Ronnie ShepherdSamuel Fowler is a 48 y.o. male who is currently on an anti-coagulation regimen.    RECENT RESULTS: Recent results are below, the most recent result is correlated with a dose of 30 mg. per week: Lab Results  Component Value Date   INR 2.40 04/26/2014   INR 2.2 03/29/2014   INR 2.10 03/01/2014    ANTI-COAG DOSE: Anticoagulation Dose Instructions as of 04/26/2014      [name]   New Dose 5 mg 5 mg 5 mg 2.5 mg 5 mg 5 mg 2.5 mg       ANTICOAG SUMMARY: Anticoagulation Episode Summary    Current INR goal 2.0-3.0  Next INR check 05/31/2014  INR from last check 2.40 (04/26/2014)  Weekly max dose   Target end date Indefinite  [facility] check location [facility]  Preferred lab   Send INR reminders to [name]   Indications  Long-term (current) use of anticoagulants [Z79.01] Hypercoagulable state primary [D68.52]        Comments       Anticoagulation Care Providers    Provider Role Specialty Phone number   Burns SpainElizabeth A Butcher, MD  Internal Medicine [phone]      ANTICOAG TODAY: Anticoagulation Summary as of 04/26/2014    INR goal 2.0-3.0  Selected INR 2.40 (04/26/2014)  Next INR check 05/31/2014  Target end date Indefinite   Indications  Long-term (current) use of anticoagulants [Z79.01] Hypercoagulable state primary [D68.52]      Anticoagulation Episode Summary    [facility]R check location [facility]   Preferred lab    Send INR reminders to [name]   Comments     Anticoagulation Care Providers    Provider Role Specialty Phone number   Burns SpainElizabeth A Butcher, MD  Internal Medicine [phone]      PATIENT INSTRUCTIONS: Patient Instructions  Patient instructed to take medications as defined in the Anti-coagulation Track section of this encounter.  Patient instructed to take today's dose.  Patient verbalized understanding of these instructions.       FOLLOW-UP Return in 5 weeks (on 05/31/2014)  for Follow up INR at 4:15PM.  Hulen LusterJames Vonnie Spagnolo, [name], [name]

## 2014-05-31 ENCOUNTER — Ambulatory Visit (INDEPENDENT_AMBULATORY_CARE_PROVIDER_SITE_OTHER): Payer: Managed Care, Other (non HMO) | Admitting: Pharmacist

## 2014-05-31 ENCOUNTER — Telehealth: Payer: Self-pay | Admitting: Pharmacist

## 2014-05-31 DIAGNOSIS — D6852 Prothrombin gene mutation: Secondary | ICD-10-CM

## 2014-05-31 DIAGNOSIS — D6859 Other primary thrombophilia: Secondary | ICD-10-CM

## 2014-05-31 DIAGNOSIS — Z7901 Long term (current) use of anticoagulants: Secondary | ICD-10-CM

## 2014-05-31 DIAGNOSIS — Z86711 Personal history of pulmonary embolism: Secondary | ICD-10-CM

## 2014-05-31 LAB — POCT INR: INR: 2.3

## 2014-05-31 MED ORDER — WARFARIN SODIUM 5 MG PO TABS: ORAL_TABLET | ORAL | Status: DC

## 2014-05-31 NOTE — Patient Instructions (Signed)
Patient instructed to take medications as defined in the Anti-coagulation Track section of this encounter.  Patient instructed to take today's dose.  Patient verbalized understanding of these instructions.    

## 2014-05-31 NOTE — Progress Notes (Signed)
Anti-Coagulation Progress Note  Ronnie ShepherdSamuel Date is a 49 y.o. male who is currently on an anti-coagulation regimen.    RECENT RESULTS: Recent results are below, the most recent result is correlated with a dose of 30 mg. per week: Lab Results  Component Value Date   INR 2.30 05/31/2014   INR 2.40 04/26/2014   INR 2.2 03/29/2014    ANTI-COAG DOSE: Anticoagulation Dose Instructions as of 05/31/2014      [name]   New Dose 5 mg 5 mg 5 mg 2.5 mg 5 mg 5 mg 5 mg       ANTICOAG SUMMARY: Anticoagulation Episode Summary    Current INR goal 2.0-3.0  Next INR check 06/28/2014  INR from last check 2.30 (05/31/2014)  Weekly max dose   Target end date Indefinite  [facility] check location [facility]  Preferred lab   Send INR reminders to [name]   Indications  Long-term (current) use of anticoagulants [Z79.01] Hypercoagulable state primary [D68.52]        Comments       Anticoagulation Care Providers    Provider Role Specialty Phone number   Burns SpainElizabeth A Butcher, MD  Internal Medicine [phone]      ANTICOAG TODAY: Anticoagulation Summary as of 05/31/2014    INR goal 2.0-3.0  Selected INR 2.30 (05/31/2014)  Next INR check 06/28/2014  Target end date Indefinite   Indications  Long-term (current) use of anticoagulants [Z79.01] Hypercoagulable state primary [D68.52]      Anticoagulation Episode Summary    [facility]R check location [facility]   Preferred lab    Send INR reminders to [name]   Comments     Anticoagulation Care Providers    Provider Role Specialty Phone number   Burns SpainElizabeth A Butcher, MD  Internal Medicine [phone]      PATIENT INSTRUCTIONS: Patient Instructions  Patient instructed to take medications as defined in the Anti-coagulation Track section of this encounter.  Patient instructed to take today's dose.  Patient verbalized understanding of these instructions.       FOLLOW-UP Return in 4 weeks (on 06/28/2014) for  Follow up INR at 4:30PM.  [name], [name], [name]

## 2014-06-23 ENCOUNTER — Telehealth: Payer: Self-pay | Admitting: Internal Medicine

## 2014-06-23 NOTE — Telephone Encounter (Signed)
Call to patient to confirm appointment for 06/24/14 at 3:45 lmtcb

## 2014-06-24 ENCOUNTER — Encounter: Payer: Managed Care, Other (non HMO) | Admitting: Internal Medicine

## 2014-06-25 ENCOUNTER — Telehealth: Payer: Self-pay | Admitting: Pharmacist

## 2014-06-25 NOTE — Telephone Encounter (Signed)
Call to patient to confirm appt for 06/28/14 at 4:30. lmtcb

## 2014-06-28 ENCOUNTER — Ambulatory Visit (INDEPENDENT_AMBULATORY_CARE_PROVIDER_SITE_OTHER): Payer: Managed Care, Other (non HMO) | Admitting: Pharmacist

## 2014-06-28 DIAGNOSIS — D6852 Prothrombin gene mutation: Secondary | ICD-10-CM

## 2014-06-28 DIAGNOSIS — Z7901 Long term (current) use of anticoagulants: Secondary | ICD-10-CM

## 2014-06-28 DIAGNOSIS — D6859 Other primary thrombophilia: Secondary | ICD-10-CM

## 2014-06-28 LAB — POCT INR: INR: 2.5

## 2014-06-28 NOTE — Patient Instructions (Signed)
Patient instructed to take medications as defined in the Anti-coagulation Track section of this encounter.  Patient instructed to take today's dose.  Patient verbalized understanding of these instructions.    

## 2014-06-28 NOTE — Progress Notes (Signed)
Anti-Coagulation Progress Note  Ronnie ShepherdSamuel Fowler is a 49 y.o. male who is currently on an anti-coagulation regimen.    RECENT RESULTS: Recent results are below, the most recent result is correlated with a dose of 32.5 mg. per week: Lab Results  Component Value Date   INR 2.50 06/28/2014   INR 2.30 05/31/2014   INR 2.40 04/26/2014    ANTI-COAG DOSE: Anticoagulation Dose Instructions as of 06/28/2014      [name]   New Dose 5 mg 5 mg 5 mg 5 mg 5 mg 5 mg 5 mg       ANTICOAG SUMMARY: Anticoagulation Episode Summary    Current INR goal 2.0-3.0  Next INR check 08/02/2014  INR from last check 2.50 (06/28/2014)  Weekly max dose   Target end date Indefinite  [facility] check location [facility]  Preferred lab   Send INR reminders to [name]   Indications  Long-term (current) use of anticoagulants [Z79.01] Hypercoagulable state primary [D68.52]        Comments       Anticoagulation Care Providers    Provider Role Specialty Phone number   Burns SpainElizabeth A Butcher, MD  Internal Medicine [phone]      ANTICOAG TODAY: Anticoagulation Summary as of 06/28/2014    INR goal 2.0-3.0  Selected INR 2.50 (06/28/2014)  Next INR check 08/02/2014  Target end date Indefinite   Indications  Long-term (current) use of anticoagulants [Z79.01] Hypercoagulable state primary [D68.52]      Anticoagulation Episode Summary    [facility]R check location [facility]   Preferred lab    Send INR reminders to [name]   Comments     Anticoagulation Care Providers    Provider Role Specialty Phone number   Burns SpainElizabeth A Butcher, MD  Internal Medicine [phone]      PATIENT INSTRUCTIONS: Patient Instructions  Patient instructed to take medications as defined in the Anti-coagulation Track section of this encounter.  Patient instructed to take today's dose.  Patient verbalized understanding of these instructions.       FOLLOW-UP Return in 5 weeks (on 08/02/2014) for  Follow up INR at 4:30PM.  Hulen LusterJames Evette Diclemente, [name], [name]

## 2014-06-29 NOTE — Progress Notes (Signed)
INTERNAL MEDICINE TEACHING ATTENDING ADDENDUM - Elva Breaker M.D  Duration- indefinite, Indication- recurrent PE, protein C deficiency, INR- therapeutic. Agree with Dr. Saralyn PilarGroce's recommendations as outlined in his note.

## 2014-07-29 ENCOUNTER — Telehealth: Payer: Self-pay | Admitting: Pharmacist

## 2014-07-29 NOTE — Telephone Encounter (Signed)
Call to patient to confirm appointment for 08/02/14 at 4:30. lmtcb

## 2014-08-02 ENCOUNTER — Ambulatory Visit (INDEPENDENT_AMBULATORY_CARE_PROVIDER_SITE_OTHER): Payer: Managed Care, Other (non HMO) | Admitting: Pharmacist

## 2014-08-02 DIAGNOSIS — D6852 Prothrombin gene mutation: Secondary | ICD-10-CM

## 2014-08-02 DIAGNOSIS — D6859 Other primary thrombophilia: Secondary | ICD-10-CM

## 2014-08-02 DIAGNOSIS — Z7901 Long term (current) use of anticoagulants: Secondary | ICD-10-CM | POA: Diagnosis not present

## 2014-08-02 LAB — POCT INR: INR: 2.6

## 2014-08-02 NOTE — Progress Notes (Addendum)
  Anti-Coagulation Progress Note Ronnie Fowler is a 49 y.o. male who is currently on an anti-coagulation regimen.  RECENT RESULTS: Recent results are below, the most recent result is correlated with a dose of 32.5 mg. per week: Lab Results  Component Value Date   INR 2.6 08/02/2014   INR 2.50 06/28/2014   INR 2.30 05/31/2014   ANTI-COAG DOSE: Anticoagulation Dose Instructions as of 08/02/2014      [name]   New Dose 5 mg 5 mg 5 mg 2.5 mg 5 mg 5 mg 5 mg    Description        Patient states he is taking 5 mg on all days except 2.5 mg on Wednesday.  INR is in therapeutic range, so I left him on the 32.5 mg/week.      ANTICOAG SUMMARY: Anticoagulation Episode Summary    Current INR goal 2.0-3.0  Next INR check 09/06/2014  INR from last check 2.6 (08/02/2014)  Weekly max dose   Target end date Indefinite  [facility] check location [facility]  Preferred lab   Send INR reminders to [name]   Indications  Long-term (current) use of anticoagulants [Z79.01] Hypercoagulable state primary [D68.52]        Comments       Anticoagulation Care Providers    Provider Role Specialty Phone number   Burns SpainElizabeth A Butcher, MD  Internal Medicine [phone]     ANTICOAG TODAY: Anticoagulation Summary as of 08/02/2014    INR goal 2.0-3.0  Selected INR 2.6 (08/02/2014)  Next INR check 09/06/2014  Target end date Indefinite   Indications  Long-term (current) use of anticoagulants [Z79.01] Hypercoagulable state primary [D68.52]      Anticoagulation Episode Summary    [facility] check location [facility]   Preferred lab    Send INR reminders to [name]   Comments     Anticoagulation Care Providers    Provider Role Specialty Phone number   Burns SpainElizabeth A Butcher, MD  Internal Medicine [phone]     PATIENT INSTRUCTIONS: Patient Instructions  Patient instructed to take medications as defined in the Anti-coagulation Track section of this encounter. Patient  instructed to take today's dose. Patient verbalized understanding of these instructions.    FOLLOW-UP Return in about 5 weeks (around 09/06/2014) for Follow-up INR at 4:30pm.  Ronnie Fowler, Ronnie Fowler Clinical Pharmacy Resident 08/02/2014 4:32 PM  I agree with the assessment, diagnosis, and plan of care documented by the pharmacy resident.

## 2014-08-02 NOTE — Patient Instructions (Signed)
Patient instructed to take medications as defined in the Anti-coagulation Track section of this encounter.  Patient instructed to take today's dose.  Patient verbalized understanding of these instructions.    

## 2014-08-04 NOTE — Addendum Note (Signed)
Addended by: Mliss FritzKIM, [name] on: 08/04/2014 10:07 AM   Modules accepted: Level of Service

## 2014-08-13 ENCOUNTER — Ambulatory Visit (INDEPENDENT_AMBULATORY_CARE_PROVIDER_SITE_OTHER): Payer: PRIVATE HEALTH INSURANCE | Admitting: Family Medicine

## 2014-08-13 VITALS — BP 142/92 | HR 63 | Temp 98.6°F | Resp 17 | Ht 67.5 in | Wt 190.0 lb

## 2014-08-13 DIAGNOSIS — H60392 Other infective otitis externa, left ear: Secondary | ICD-10-CM | POA: Diagnosis not present

## 2014-08-13 DIAGNOSIS — H6123 Impacted cerumen, bilateral: Secondary | ICD-10-CM | POA: Diagnosis not present

## 2014-08-13 DIAGNOSIS — I10 Essential (primary) hypertension: Secondary | ICD-10-CM | POA: Diagnosis not present

## 2014-08-13 MED ORDER — AMLODIPINE BESYLATE 10 MG PO TABS: 10.0000 mg | ORAL_TABLET | Freq: Every day | ORAL | Status: DC

## 2014-08-13 MED ORDER — OFLOXACIN 0.3 % OT SOLN: 10.0000 [drp] | Freq: Every day | OTIC | Status: DC

## 2014-08-13 NOTE — Progress Notes (Signed)
Subjective:  This chart was scribed for Meredith StaggersJeffrey Rayhana Slider, MD by Jarvis Morganaylor Ferguson, Medical Scribe. This patient was seen in Room 8 and the patient's care was started at 4:42 PM.   Patient ID: Ronnie Fowler, male    DOB: 07-Apr-1966, 49 y.o.   MRN: [id number]  Chief Complaint  Patient presents with  . Ear Pain  . Allergies    HPI Ronnie Fowler is a 49 y.o. male who [facility] otalgia for 3 days. He has taken OTC ear ache drops since yesterday. He states he used 2 drops yesterday with no relief. Pt states he was initially having seasonal allergy symptoms with nasal congestion, rhinorrhea, watery eyes and sneezing but took Claritin which resolved the symptoms. Pt uses ear plugs at work. He denies q-tip use. He denies any ear discharge, decreased hearing, fever or chills.   Pt is also here for a refill of his amlodipine. He was last seen for this by Dr. Merla Richesoolittle on June 2015. At that time was only taking BP medication when it was elevated. He states he is taking it when it runs high and states it is only high about 2x a week. He records his BP at home and states it usually runs around 150/90. Pt states when he takes the medication his BP is around 125/85. He denies any side effects from the medication. Last renal function was from August 2015 with creatinine of 0.96. He denies any SOB, chest tightness, chest pain, HA, dizziness, nausea or vomiting,   PCP: Heywood IlesPatel, Rushil, MD  Patient Active Problem List   Diagnosis Date Noted  . Gross hematuria, painless 12/24/2013  . Preventative health care 06/01/2013  . HTN (hypertension)   . Lupus anticoagulant positive   . Left shoulder pain 08/30/2010  . LUQ abdominal pain 08/30/2010  . Long-term (current) use of anticoagulants 05/23/2010  . Hypercoagulable state, primary 05/23/2010  . URINARY TRACT INFECTION, ACUTE, RECURRENT 03/16/2009  . BACK PAIN, LUMBAR 04/05/2008  . KNEE PAIN, CHRONIC 12/05/2006  . PROTEIN C DEFICIENCY 04/05/2006    . PULMONARY EMBOLISM, HX OF 04/05/2006   Past Medical History  Diagnosis Date  . PE (pulmonary embolism) 03/2002; 05/2005  . Protein C deficiency   . HTN (hypertension)   . Lupus anticoagulant positive    No past surgical history on file. No Known Allergies Prior to Admission medications   Medication Sig Start Date End Date Taking? Authorizing Provider  amLODipine (NORVASC) 10 MG tablet Take 1 tablet (10 mg total) by mouth daily. 11/01/13  Yes Tonye Pearsonobert P Doolittle, MD  warfarin (COUMADIN) 5 MG tablet Take as directed by anticoagulation clinic provider. 05/31/14 05/31/15 Yes Aletta EdouardShilpa Bhardwaj, MD  calcium carbonate (OS-CAL) 1250 MG chewable tablet Chew 1 tablet by mouth daily.    Historical Provider, MD  cyclobenzaprine (FLEXERIL) 10 MG tablet Take 1 tablet (10 mg total) by mouth at bedtime. For muscle spasm Patient not taking: Reported on 08/13/2014 11/01/13   Tonye Pearsonobert P Doolittle, MD  fluticasone [employer](FLONASE) 50 MCG/ACT nasal spray 1 spray by Nasal route Daily. 07/20/10   Historical Provider, MD   History   Social History  . Marital Status: Married    Spouse Name: N/A  . Number of Children: N/A  . Years of Education: N/A   Occupational History  . Not on file.   Social History Main Topics  . Smoking status: Never Smoker   . Smokeless tobacco: Never Used  . Alcohol Use: No  . Drug Use: No  .  Sexual Activity:    Partners: Female   Other Topics Concern  . Not on file   Social History Narrative    Review of Systems  Constitutional: Negative for fever and chills.  HENT: Positive for ear pain (left). Negative for congestion, ear discharge, hearing loss, rhinorrhea and sneezing.   Eyes: Negative for discharge and itching.  Respiratory: Negative for chest tightness and shortness of breath.   Cardiovascular: Negative for chest pain.  Gastrointestinal: Negative for abdominal pain.  Neurological: Negative for dizziness and headaches.      Objective:   Physical Exam  Constitutional: He  is oriented to person, place, and time. He appears well-developed and well-nourished. No distress.  HENT:  Head: Normocephalic and atraumatic.  Right Ear: Tympanic membrane normal.  Left Ear: External ear normal. No mastoid tenderness.  Left canal is erythematous and edematous. Unable  to visualize TM through dark cerumen. External ear is non tender  Eyes: Conjunctivae and EOM are normal. Pupils are equal, round, and reactive to light.  Neck: Neck supple. No JVD present. Carotid bruit is not present. No tracheal deviation present.  Cardiovascular: Normal rate, regular rhythm and normal heart sounds.   No murmur heard. Pulmonary/Chest: Effort normal and breath sounds normal. No respiratory distress. He has no rales.  Musculoskeletal: Normal range of motion. He exhibits no edema.  Neurological: He is alert and oriented to person, place, and time.  Skin: Skin is warm and dry.  Psychiatric: He has a normal mood and affect. His behavior is normal.  Nursing note and vitals reviewed.   Filed Vitals:   08/13/14 1622  BP: 142/92  Pulse: 63  Temp: 98.6 F (37 C)  TempSrc: Oral  Resp: 17  Height: 5' 7.5" (1.715 m)  Weight: 190 lb (86.183 kg)  SpO2: 100%       Assessment & Plan:   Ronnie Fowler is a 49 y.o. male Essential hypertension - Plan: Basic metabolic panel, amLODipine (NORVASC) 10 MG tablet  - discussed need for daily dosing of amlodipine. Monitor ambulatory readings on meds, and bring to visit with PCP (Dr. Allena Katz is listed, if not followed there, can return here, but also advised need for lipid testing and CPE for other health maintenance).  If lightheadedness/dizziness, or lower readings, could decrease to 5mg  qd.   -bmp pending.   Otitis, externa, infective, left - Plan: ofloxacin (FLOXIN) 0.3 % otic solution.  Cerumen impaction, bilateral  - mild. Start floxin otic. Doubt otitis media, but unable to visualize tm with cerumen.   -after treatment of otitis externa, can try otc  Debrox.  If this does not help - RTC for lavage.   -rtc precautions, understanding expressed.    Meds ordered this encounter  Medications  . amLODipine (NORVASC) 10 MG tablet    Sig: Take 1 tablet (10 mg total) by mouth daily.    Dispense:  90 tablet    Refill:  1  . ofloxacin (FLOXIN) 0.3 % otic solution    Sig: Place 10 drops into the left ear daily. For 1 week.    Dispense:  5 mL    Refill:  0   Patient Instructions  floxin otic once per day for 7 days.   After 10 days - can use Debrox over the counter for earwax removal.  If this is not improving with over the counter treatment - can return here to have wax removed.   I refilled your blood pressure medicine - take this once per day. Keep  a record of your blood pressures outside of the office and bring them to the next office visit.  Follow up with primary provider for further refills and to check cholesterol and discuss other health maintenance at a physical.   Return to the clinic or go to the nearest emergency room if any of your symptoms worsen or new symptoms occur.  Hypertension Hypertension, commonly called high blood pressure, is when the force of blood pumping through your arteries is too strong. Your arteries are the blood vessels that carry blood from your heart throughout your body. A blood pressure reading consists of a higher number over a lower number, such as 110/72. The higher number (systolic) is the pressure inside your arteries when your heart pumps. The lower number (diastolic) is the pressure inside your arteries when your heart relaxes. Ideally you want your blood pressure below 120/80. Hypertension forces your heart to work harder to pump blood. Your arteries may become narrow or stiff. Having hypertension puts you at risk for heart disease, stroke, and other problems.  RISK FACTORS Some risk factors for high blood pressure are controllable. Others are not.  Risk factors you cannot control include:   Race.  You may be at higher risk if you are African American.  Age. Risk increases with age.  Gender. Men are at higher risk than women before age 36 years. After age 70, women are at higher risk than men. Risk factors you can control include:  Not getting enough exercise or physical activity.  Being overweight.  Getting too much fat, sugar, calories, or salt in your diet.  Drinking too much alcohol. SIGNS AND SYMPTOMS Hypertension does not usually cause signs or symptoms. Extremely high blood pressure (hypertensive crisis) may cause headache, anxiety, shortness of breath, and nosebleed. DIAGNOSIS  To check if you have hypertension, your health care provider will measure your blood pressure while you are seated, with your arm held at the level of your heart. It should be measured at least twice using the same arm. Certain conditions can cause a difference in blood pressure between your right and left arms. A blood pressure reading that is higher than normal on one occasion does not mean that you need treatment. If one blood pressure reading is high, ask your health care provider about having it checked again. TREATMENT  Treating high blood pressure includes making lifestyle changes and possibly taking medicine. Living a healthy lifestyle can help lower high blood pressure. You may need to change some of your habits. Lifestyle changes may include:  Following the DASH diet. This diet is high in fruits, vegetables, and whole grains. It is low in salt, red meat, and added sugars.  Getting at least 2 hours of brisk physical activity every week.  Losing weight if necessary.  Not smoking.  Limiting alcoholic beverages.  Learning ways to reduce stress. If lifestyle changes are not enough to get your blood pressure under control, your health care provider may prescribe medicine. You may need to take more than one. Work closely with your health care provider to understand the risks and  benefits. HOME CARE INSTRUCTIONS  Have your blood pressure rechecked as directed by your health care provider.   Take medicines only as directed by your health care provider. Follow the directions carefully. Blood pressure medicines must be taken as prescribed. The medicine does not work as well when you skip doses. Skipping doses also puts you at risk for problems.   Do not smoke.  Monitor your blood pressure at home as directed by your health care provider. SEEK MEDICAL CARE IF:   You think you are having a reaction to medicines taken.  You have recurrent headaches or feel dizzy.  You have swelling in your ankles.  You have trouble with your vision. SEEK IMMEDIATE MEDICAL CARE IF:  You develop a severe headache or confusion.  You have unusual weakness, numbness, or feel faint.  You have severe chest or abdominal pain.  You vomit repeatedly.  You have trouble breathing. MAKE SURE YOU:   Understand these instructions.  Will watch your condition.  Will get help right away if you are not doing well or get worse. Document Released: 04/23/2005 Document Revised: 09/07/2013 Document Reviewed: 02/13/2013 [facility] Patient Information 2015 [place], [place]. This information is not intended to replace advice given to you by your health care provider. Make sure you discuss any questions you have with your health care provider.   Otitis Externa Otitis externa is a bacterial or fungal infection of the outer ear canal. This is the area from the eardrum to the outside of the ear. Otitis externa is sometimes called "swimmer's ear." CAUSES  Possible causes of infection include:  Swimming in dirty water.  Moisture remaining in the ear after swimming or bathing.  Mild injury (trauma) to the ear.  Objects stuck in the ear (foreign body).  Cuts or scrapes (abrasions) on the outside of the ear. SIGNS AND SYMPTOMS  The first symptom of infection is often itching in the ear canal.  Later signs and symptoms may include swelling and redness of the ear canal, ear pain, and yellowish-white fluid (pus) coming from the ear. The ear pain may be worse when pulling on the earlobe. DIAGNOSIS  Your health care provider will perform a physical exam. A sample of fluid may be taken from the ear and examined for bacteria or fungi. TREATMENT  Antibiotic ear drops are often given for 10 to 14 days. Treatment may also include pain medicine or corticosteroids to reduce itching and swelling. HOME CARE INSTRUCTIONS   Apply antibiotic ear drops to the ear canal as prescribed by your health care provider.  Take medicines only as directed by your health care provider.  If you have diabetes, follow any additional treatment instructions from your health care provider.  Keep all follow-up visits as directed by your health care provider. PREVENTION   Keep your ear dry. Use the corner of a towel to absorb water out of the ear canal after swimming or bathing.  Avoid scratching or putting objects inside your ear. This can damage the ear canal or remove the protective wax that lines the canal. This makes it easier for bacteria and fungi to grow.  Avoid swimming in lakes, polluted water, or poorly chlorinated pools.  You may use ear drops made of rubbing alcohol and vinegar after swimming. Combine equal parts of white vinegar and alcohol in a bottle. Put 3 or 4 drops into each ear after swimming. SEEK MEDICAL CARE IF:   You have a fever.  Your ear is still red, swollen, painful, or draining pus after 3 days.  Your redness, swelling, or pain gets worse.  You have a severe headache.  You have redness, swelling, pain, or tenderness in the area behind your ear. MAKE SURE YOU:   Understand these instructions.  Will watch your condition.  Will get help right away if you are not doing well or get worse. Document Released: 04/23/2005 Document Revised: 09/07/2013  Document Reviewed:  05/10/2011 [facility] Patient Information 2015 [place], [place]. This information is not intended to replace advice given to you by your health care provider. Make sure you discuss any questions you have with your health care provider. Cerumen Impaction A cerumen impaction is when the wax in your ear forms a plug. This plug usually causes reduced hearing. Sometimes it also causes an earache or dizziness. Removing a cerumen impaction can be difficult and painful. The wax sticks to the ear canal. The canal is sensitive and bleeds easily. If you try to remove a heavy wax buildup with a cotton tipped swab, you may push it in further. Irrigation with water, suction, and small ear curettes may be used to clear out the wax. If the impaction is fixed to the skin in the ear canal, ear drops may be needed for a few days to loosen the wax. People who build up a lot of wax frequently can use ear wax removal products available in your local drugstore. SEEK MEDICAL CARE IF:  You develop an earache, increased hearing loss, or marked dizziness. Document Released: 05/31/2004 Document Revised: 07/16/2011 Document Reviewed: 07/21/2009 [facility] Patient Information 2015 [place], [place]. This information is not intended to replace advice given to you by your health care provider. Make sure you discuss any questions you have with your health care provider.     I personally performed the services described in this documentation, which was scribed in my presence. The recorded information has been reviewed and considered, and addended by me as needed.

## 2014-08-13 NOTE — Patient Instructions (Addendum)
floxin otic once per day for 7 days.   After 10 days - can use Debrox over the counter for earwax removal.  If this is not improving with over the counter treatment - can return here to have wax removed.   I refilled your blood pressure medicine - take this once per day. Keep a record of your blood pressures outside of the office and bring them to the next office visit.  Follow up with primary provider for further refills and to check cholesterol and discuss other health maintenance at a physical.   Return to the clinic or go to the nearest emergency room if any of your symptoms worsen or new symptoms occur.  Hypertension Hypertension, commonly called high blood pressure, is when the force of blood pumping through your arteries is too strong. Your arteries are the blood vessels that carry blood from your heart throughout your body. A blood pressure reading consists of a higher number over a lower number, such as 110/72. The higher number (systolic) is the pressure inside your arteries when your heart pumps. The lower number (diastolic) is the pressure inside your arteries when your heart relaxes. Ideally you want your blood pressure below 120/80. Hypertension forces your heart to work harder to pump blood. Your arteries may become narrow or stiff. Having hypertension puts you at risk for heart disease, stroke, and other problems.  RISK FACTORS Some risk factors for high blood pressure are controllable. Others are not.  Risk factors you cannot control include:   Race. You may be at higher risk if you are African American.  Age. Risk increases with age.  Gender. Men are at higher risk than women before age 86 years. After age 14, women are at higher risk than men. Risk factors you can control include:  Not getting enough exercise or physical activity.  Being overweight.  Getting too much fat, sugar, calories, or salt in your diet.  Drinking too much alcohol. SIGNS AND  SYMPTOMS Hypertension does not usually cause signs or symptoms. Extremely high blood pressure (hypertensive crisis) may cause headache, anxiety, shortness of breath, and nosebleed. DIAGNOSIS  To check if you have hypertension, your health care provider will measure your blood pressure while you are seated, with your arm held at the level of your heart. It should be measured at least twice using the same arm. Certain conditions can cause a difference in blood pressure between your right and left arms. A blood pressure reading that is higher than normal on one occasion does not mean that you need treatment. If one blood pressure reading is high, ask your health care provider about having it checked again. TREATMENT  Treating high blood pressure includes making lifestyle changes and possibly taking medicine. Living a healthy lifestyle can help lower high blood pressure. You may need to change some of your habits. Lifestyle changes may include:  Following the DASH diet. This diet is high in fruits, vegetables, and whole grains. It is low in salt, red meat, and added sugars.  Getting at least 2 hours of brisk physical activity every week.  Losing weight if necessary.  Not smoking.  Limiting alcoholic beverages.  Learning ways to reduce stress. If lifestyle changes are not enough to get your blood pressure under control, your health care provider may prescribe medicine. You may need to take more than one. Work closely with your health care provider to understand the risks and benefits. HOME CARE INSTRUCTIONS  Have your blood pressure rechecked as directed  by your health care provider.   Take medicines only as directed by your health care provider. Follow the directions carefully. Blood pressure medicines must be taken as prescribed. The medicine does not work as well when you skip doses. Skipping doses also puts you at risk for problems.   Do not smoke.   Monitor your blood pressure at  home as directed by your health care provider. SEEK MEDICAL CARE IF:   You think you are having a reaction to medicines taken.  You have recurrent headaches or feel dizzy.  You have swelling in your ankles.  You have trouble with your vision. SEEK IMMEDIATE MEDICAL CARE IF:  You develop a severe headache or confusion.  You have unusual weakness, numbness, or feel faint.  You have severe chest or abdominal pain.  You vomit repeatedly.  You have trouble breathing. MAKE SURE YOU:   Understand these instructions.  Will watch your condition.  Will get help right away if you are not doing well or get worse. Document Released: 04/23/2005 Document Revised: 09/07/2013 Document Reviewed: 02/13/2013 [facility] Patient Information 2015 [place], [place]. This information is not intended to replace advice given to you by your health care provider. Make sure you discuss any questions you have with your health care provider.   Otitis Externa Otitis externa is a bacterial or fungal infection of the outer ear canal. This is the area from the eardrum to the outside of the ear. Otitis externa is sometimes called "swimmer's ear." CAUSES  Possible causes of infection include:  Swimming in dirty water.  Moisture remaining in the ear after swimming or bathing.  Mild injury (trauma) to the ear.  Objects stuck in the ear (foreign body).  Cuts or scrapes (abrasions) on the outside of the ear. SIGNS AND SYMPTOMS  The first symptom of infection is often itching in the ear canal. Later signs and symptoms may include swelling and redness of the ear canal, ear pain, and yellowish-white fluid (pus) coming from the ear. The ear pain may be worse when pulling on the earlobe. DIAGNOSIS  Your health care provider will perform a physical exam. A sample of fluid may be taken from the ear and examined for bacteria or fungi. TREATMENT  Antibiotic ear drops are often given for 10 to 14 days. Treatment may  also include pain medicine or corticosteroids to reduce itching and swelling. HOME CARE INSTRUCTIONS   Apply antibiotic ear drops to the ear canal as prescribed by your health care provider.  Take medicines only as directed by your health care provider.  If you have diabetes, follow any additional treatment instructions from your health care provider.  Keep all follow-up visits as directed by your health care provider. PREVENTION   Keep your ear dry. Use the corner of a towel to absorb water out of the ear canal after swimming or bathing.  Avoid scratching or putting objects inside your ear. This can damage the ear canal or remove the protective wax that lines the canal. This makes it easier for bacteria and fungi to grow.  Avoid swimming in lakes, polluted water, or poorly chlorinated pools.  You may use ear drops made of rubbing alcohol and vinegar after swimming. Combine equal parts of white vinegar and alcohol in a bottle. Put 3 or 4 drops into each ear after swimming. SEEK MEDICAL CARE IF:   You have a fever.  Your ear is still red, swollen, painful, or draining pus after 3 days.  Your redness, swelling, or  pain gets worse.  You have a severe headache.  You have redness, swelling, pain, or tenderness in the area behind your ear. MAKE SURE YOU:   Understand these instructions.  Will watch your condition.  Will get help right away if you are not doing well or get worse. Document Released: 04/23/2005 Document Revised: 09/07/2013 Document Reviewed: 05/10/2011 [facility] Patient Information 2015 [place], [place]. This information is not intended to replace advice given to you by your health care provider. Make sure you discuss any questions you have with your health care provider. Cerumen Impaction A cerumen impaction is when the wax in your ear forms a plug. This plug usually causes reduced hearing. Sometimes it also causes an earache or dizziness. Removing a cerumen impaction  can be difficult and painful. The wax sticks to the ear canal. The canal is sensitive and bleeds easily. If you try to remove a heavy wax buildup with a cotton tipped swab, you may push it in further. Irrigation with water, suction, and small ear curettes may be used to clear out the wax. If the impaction is fixed to the skin in the ear canal, ear drops may be needed for a few days to loosen the wax. People who build up a lot of wax frequently can use ear wax removal products available in your local drugstore. SEEK MEDICAL CARE IF:  You develop an earache, increased hearing loss, or marked dizziness. Document Released: 05/31/2004 Document Revised: 07/16/2011 Document Reviewed: 07/21/2009 [facility] Patient Information 2015 [place], [place]. This information is not intended to replace advice given to you by your health care provider. Make sure you discuss any questions you have with your health care provider.

## 2014-08-14 LAB — BASIC METABOLIC PANEL
BUN: 14 mg/dL (ref 6–23)
CO2: 26 mEq/L (ref 19–32)
Calcium: 9.3 mg/dL (ref 8.4–10.5)
Chloride: 104 mEq/L (ref 96–112)
Creat: 0.99 mg/dL (ref 0.50–1.35)
GLUCOSE: 85 mg/dL (ref 70–99)
POTASSIUM: 3.9 meq/L (ref 3.5–5.3)
SODIUM: 136 meq/L (ref 135–145)

## 2014-09-09 ENCOUNTER — Telehealth: Payer: Self-pay | Admitting: Internal Medicine

## 2014-09-09 NOTE — Telephone Encounter (Signed)
Call to patient to confirm appointment for 09/10/14 at 1:45 lmtcb

## 2014-09-10 ENCOUNTER — Encounter: Payer: Managed Care, Other (non HMO) | Admitting: Internal Medicine

## 2014-09-10 ENCOUNTER — Ambulatory Visit (INDEPENDENT_AMBULATORY_CARE_PROVIDER_SITE_OTHER): Payer: Managed Care, Other (non HMO) | Admitting: Pharmacist

## 2014-09-10 DIAGNOSIS — Z7901 Long term (current) use of anticoagulants: Secondary | ICD-10-CM

## 2014-09-10 DIAGNOSIS — D6852 Prothrombin gene mutation: Secondary | ICD-10-CM | POA: Diagnosis not present

## 2014-09-10 DIAGNOSIS — D6859 Other primary thrombophilia: Secondary | ICD-10-CM

## 2014-09-10 DIAGNOSIS — Z86711 Personal history of pulmonary embolism: Secondary | ICD-10-CM

## 2014-09-10 LAB — POCT INR: INR: 2.3

## 2014-09-10 MED ORDER — WARFARIN SODIUM 5 MG PO TABS: ORAL_TABLET | ORAL | Status: DC

## 2014-09-10 NOTE — Patient Instructions (Addendum)
Patient educated about medication as defined in this encounter and verbalized understanding by repeating back instructions provided.   

## 2014-09-10 NOTE — Progress Notes (Signed)
CLINICAL PHARMACIST ANTICOAG NOTE Ronnie ShepherdSamuel Fowler is a 49 y.o. male who reports to the clinic for monitoring of warfarin treatment.    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, hypercoaguable state Duration: indefinite  Anticoagulation Clinic Visit History: Anticoagulation Episode Summary    Current INR goal 2.0-3.0  Next INR check 11/05/2014  INR from last check 2.3 (09/10/2014)  Weekly max dose   Target end date Indefinite  [facility]  Preferred lab   Send INR reminders to [name]   Indications  Long-term (current) use of anticoagulants [Z79.01] Hypercoagulable state primary [D68.52]        Comments       Anticoagulation Care Providers    Provider Role Specialty Phone number   Burns SpainElizabeth A Butcher, MD  Internal Medicine [phone]     ASSESSMENT Recent Results: Recent results are below, the most recent result is correlated with a dose of 32.5 mg per week: Lab Results  Component Value Date   INR 2.3 09/10/2014   INR 2.6 08/02/2014   INR 2.50 06/28/2014    INR today: Therapeutic  Anticoagulation Dosing: INR as of 09/10/2014 and Previous Dosing Information    INR Dt INR Goal Wkly Tot [name]   09/10/2014 2.3 2.0-3.0 32.5 mg 5 mg 5 mg 5 mg 2.5 mg 5 mg 5 mg 5 mg    Previous description        Patient states he is taking 5 mg on all days except 2.5 mg on Wednesday.  INR is in therapeutic range, so I left him on the 32.5 mg/week.    Anticoagulation Dose Instructions as of 09/10/2014      Total [name]   New Dose 32.5 mg 5 mg 5 mg 5 mg 2.5 mg 5 mg 5 mg 5 mg     (5 mg x 1)  (5 mg x 1)  (5 mg x 1)  (5 mg x 0.5)  (5 mg x 1)  (5 mg x 1)  (5 mg x 1)                           PLAN Weekly dose unchanged.   Patient Instructions  Patient educated about medication as defined in this encounter and verbalized understanding by repeating back instructions provided.       Follow-up Return in 8 weeks (on 11/05/2014) for follow-up INR on 11/05/14 @ 4:00 PM.  Marzetta BoardJennifer [name] Clinical Pharmacist  15 minutes spent face-to-face with the patient during the encounter. 50% of time spent on education. 50% of time was spent on assessment and testing.  Patient was seen in clinic by Celesta AverAmanda D'Ostroph, PharmD candidate. I agree with the assessment, diagnosis, and plan of care documented by [facility].

## 2014-09-13 NOTE — Progress Notes (Signed)
INTERNAL MEDICINE TEACHING ATTENDING ADDENDUM - Earl LagosNischal Omie Ferger M.D  Duration- lifelng, Indication- recurrent PE, hypercoaguable state, INR- therapeutic. Agree with pharmacy recommendations as outlined in their note.

## 2014-11-05 ENCOUNTER — Ambulatory Visit (INDEPENDENT_AMBULATORY_CARE_PROVIDER_SITE_OTHER): Payer: Managed Care, Other (non HMO) | Admitting: Pharmacist

## 2014-11-05 DIAGNOSIS — D6852 Prothrombin gene mutation: Secondary | ICD-10-CM | POA: Diagnosis not present

## 2014-11-05 DIAGNOSIS — Z7901 Long term (current) use of anticoagulants: Secondary | ICD-10-CM

## 2014-11-05 DIAGNOSIS — D6859 Other primary thrombophilia: Secondary | ICD-10-CM

## 2014-11-05 LAB — POCT INR: INR: 2.9

## 2014-11-05 NOTE — Progress Notes (Signed)
CLINICAL PHARMACIST ANTICOAG NOTE Ronnie ShepherdSamuel Fowler is a 49 y.o. male who reports to the clinic for monitoring of warfarin treatment.    Indication: history PE and protein C deficiency Duration: indefinite  Anticoagulation Clinic Visit History: Anticoagulation Episode Summary    Current INR goal 2.0-3.0  Next INR check 12/31/2014  INR from last check 2.9 (11/05/2014)  Weekly max dose   Target end date Indefinite  [facility]  Preferred lab   Send INR reminders to [name]   Indications  Long-term (current) use of anticoagulants [Z79.01] Hypercoagulable state primary [D68.52]        Comments       Anticoagulation Care Providers    Provider Role Specialty Phone number   Burns SpainElizabeth A Butcher, MD  Internal Medicine [phone]     ASSESSMENT Recent Results: Recent results are below, the most recent result is correlated with a dose of 32.5 mg per week: Lab Results  Component Value Date   INR 2.9 11/05/2014   INR 2.3 09/10/2014   INR 2.6 08/02/2014    INR today: Therapeutic  Anticoagulation Dosing: INR as of 11/05/2014 and Previous Dosing Information    INR Dt [facility]al [facility] Tot [name]   11/05/2014 2.9 2.0-3.0 32.5 mg 5 mg 5 mg 5 mg 2.5 mg 5 mg 5 mg 5 mg    Anticoagulation Dose Instructions as of 11/05/2014      Total [name]   New Dose 32.5 mg 5 mg 5 mg 5 mg 2.5 mg 5 mg 5 mg 5 mg     (5 mg x 1)  (5 mg x 1)  (5 mg x 1)  (5 mg x 0.5)  (5 mg x 1)  (5 mg x 1)  (5 mg x 1)                           PLAN Weekly dose was unchanged   Marzetta BoardJennifer [name] Clinical Pharmacist  15 minutes spent face-to-face with the patient during the encounter. 50% of time spent on education. 50% of time was spent on assessment and plan.

## 2014-11-05 NOTE — Patient Instructions (Signed)
Patient educated about medication as defined in this encounter and verbalized understanding by repeating back instructions provided.   

## 2014-11-09 NOTE — Progress Notes (Signed)
I have reviewed Dr. Elmyra RicksKim's note and plan.

## 2014-12-30 ENCOUNTER — Telehealth: Payer: Self-pay | Admitting: Internal Medicine

## 2014-12-30 NOTE — Telephone Encounter (Signed)
Call to patient to confirm appointment for 12/31/14 at 3:00 and 3:45 lmtcb

## 2014-12-31 ENCOUNTER — Ambulatory Visit (INDEPENDENT_AMBULATORY_CARE_PROVIDER_SITE_OTHER): Payer: Managed Care, Other (non HMO) | Admitting: Pharmacist

## 2014-12-31 ENCOUNTER — Encounter: Payer: Self-pay | Admitting: Internal Medicine

## 2014-12-31 ENCOUNTER — Ambulatory Visit (INDEPENDENT_AMBULATORY_CARE_PROVIDER_SITE_OTHER): Payer: Managed Care, Other (non HMO) | Admitting: Internal Medicine

## 2014-12-31 VITALS — BP 168/90 | HR 48 | Temp 98.2°F | Ht 68.0 in | Wt 198.5 lb

## 2014-12-31 DIAGNOSIS — R310 Gross hematuria: Secondary | ICD-10-CM

## 2014-12-31 DIAGNOSIS — I10 Essential (primary) hypertension: Secondary | ICD-10-CM

## 2014-12-31 DIAGNOSIS — D6859 Other primary thrombophilia: Secondary | ICD-10-CM

## 2014-12-31 DIAGNOSIS — Z7901 Long term (current) use of anticoagulants: Secondary | ICD-10-CM

## 2014-12-31 DIAGNOSIS — D6852 Prothrombin gene mutation: Secondary | ICD-10-CM

## 2014-12-31 DIAGNOSIS — Z0000 Encounter for general adult medical examination without abnormal findings: Secondary | ICD-10-CM

## 2014-12-31 LAB — POCT INR: INR: 2.2

## 2014-12-31 MED ORDER — AMLODIPINE BESYLATE 10 MG PO TABS: 10.0000 mg | ORAL_TABLET | Freq: Every day | ORAL | Status: DC

## 2014-12-31 NOTE — Assessment & Plan Note (Signed)
BP Readings from Last 3 Encounters:  12/31/14 168/90  08/13/14 142/92  12/23/13 150/92    Lab Results  Component Value Date   NA 136 08/13/2014   K 3.9 08/13/2014   CREATININE 0.99 08/13/2014    Assessment: Blood pressure control: moderately elevated Progress toward BP goal:  unchanged Comments: He reports he normally takes amlodipine 10 mg at night but then asking for refill. His blood pressure on repeat was also elevated. He denies any chest pain, shortness of breath.  Plan: Medications:  Amlodipine 10 mg Educational resources provided:   Self management tools provided:   Other plans: I refilled his medication and advised him to call should he ever need any additional refills to which he acknowledged understanding.

## 2014-12-31 NOTE — Patient Instructions (Signed)
Patient educated about medication as defined in this encounter and verbalized understanding by repeating back instructions provided.   

## 2014-12-31 NOTE — Assessment & Plan Note (Signed)
He reports he will receive the flu vaccine next month how to work and thus declines receiving it today.

## 2014-12-31 NOTE — Assessment & Plan Note (Signed)
He reports he no longer has this symptom.

## 2014-12-31 NOTE — Progress Notes (Signed)
   Subjective:    Patient ID: Ronnie Fowler, male    DOB: [birth year], 49 y.o.   MRN: [id number]  HPI Ronnie Fowler is a 49 year old male with hypertension, chronic anticoagulation secondary to protein C deficiency and lupus anticoagulant positivity who presents today for follow-up appointment. Please see assessment & plan for documentation of each problem.  Review of Systems  Respiratory: Negative for shortness of breath.   Cardiovascular: Negative for chest pain and leg swelling.  Gastrointestinal: Negative for nausea, vomiting, abdominal pain, diarrhea and blood in stool.  Neurological: Negative for dizziness.       Objective:   Physical Exam   Constitutional: Middle-aged [place] male. No distress.  Head: Normocephalic and atraumatic.  Eyes: Conjunctivae are normal. Pupils are 4 mm, direct, consensual, near.  Cardiovascular: Normal rate, regular rhythm and normal heart sounds.  No gallop, friction rub, murmur heard. Pulmonary/Chest: Effort normal. No respiratory distress. No wheezes, rales.  Abdominal: Soft. Bowel sounds are normal. No distension. No tenderness.  Neurological: Alert and oriented to person, place, and time. Coordination normal.  Skin: Warm and dry. Not diaphoretic.  Psychiatric: Affect appropriate         Assessment & Plan:

## 2014-12-31 NOTE — Progress Notes (Signed)
Anticoagulation Management Ronnie Fowler is a 49 y.o. male who reports to the clinic for monitoring of warfarin treatment.    Indication: protein C deficiency, hx of PE Duration: indefinite  Anticoagulation Clinic Visit History: Anticoagulation Episode Summary    Current INR goal 2.0-3.0  Next INR check 03/04/2015  INR from last check 2.2 (12/31/2014)  Weekly max dose   Target end date Indefinite  [facility]  Preferred lab   Send INR reminders to [name]   Indications  Long-term (current) use of anticoagulants [Z79.01] Hypercoagulable state primary [D68.52]        Comments       Anticoagulation Care Providers    Provider Role Specialty Phone number   Burns Spain, MD  Internal Medicine [phone]     ASSESSMENT Recent Results: Recent results are below, the most recent result is correlated with a dose of 32.5 mg per week: Lab Results  Component Value Date   INR 2.2 12/31/2014   INR 2.9 11/05/2014   INR 2.3 09/10/2014   INR today: Therapeutic  Anticoagulation Dosing: INR as of 12/31/2014 and Previous Dosing Information    INR Dt INR Goal [facility] [name]   12/31/2014 2.2 2.0-3.0 32.5 mg 5 mg 5 mg 5 mg 2.5 mg 5 mg 5 mg 5 mg    Anticoagulation Dose Instructions as of 12/31/2014      Total [name]   New Dose 32.5 mg 5 mg 5 mg 5 mg 2.5 mg 5 mg 5 mg 5 mg     (5 mg x 1)  (5 mg x 1)  (5 mg x 1)  (5 mg x 0.5)  (5 mg x 1)  (5 mg x 1)  (5 mg x 1)                           PLAN Weekly dose was unchanged. Patient has had a therapeutic INR since 2013 so decreasing frequency of follow-ups to every 8 weeks.  Patient Instructions  Patient educated about medication as defined in this encounter and verbalized understanding by repeating back instructions provided.   Follow-up Return in about 2 months (around 03/02/2015) for Follow up INR on 03/02/15 at 4 pm.  [name],[name]  15 minutes spent  face-to-face with the patient during the encounter. 50% of time spent on education. 50% of time was spent on assessment and plan.

## 2015-01-03 NOTE — Progress Notes (Signed)
Internal Medicine Clinic Attending  Case discussed with Dr. Patel,Rushil soon after the resident saw the patient.  We reviewed the resident's history and exam and pertinent patient test results.  I agree with the assessment, diagnosis, and plan of care documented in the resident's note. 

## 2015-01-04 NOTE — Progress Notes (Signed)
I have reviewed Dr. [name]'s note.  

## 2015-02-18 ENCOUNTER — Encounter: Payer: Self-pay | Admitting: Internal Medicine

## 2015-02-18 DIAGNOSIS — R310 Gross hematuria: Secondary | ICD-10-CM | POA: Insufficient documentation

## 2015-02-18 NOTE — Assessment & Plan Note (Addendum)
He reports he will receive the flu vaccine next month at work and thus declines receiving it today.

## 2015-02-25 ENCOUNTER — Ambulatory Visit (INDEPENDENT_AMBULATORY_CARE_PROVIDER_SITE_OTHER): Payer: Managed Care, Other (non HMO) | Admitting: Pharmacist

## 2015-02-25 DIAGNOSIS — Z7901 Long term (current) use of anticoagulants: Secondary | ICD-10-CM

## 2015-02-25 DIAGNOSIS — D6859 Other primary thrombophilia: Secondary | ICD-10-CM | POA: Diagnosis not present

## 2015-02-25 LAB — POCT INR
INR: 2.2
INR: 2.2

## 2015-02-25 NOTE — Patient Instructions (Signed)
Patient instructed to take medications as defined in the Anti-coagulation Track section of this encounter.  Patient instructed to take today's dose.  Patient verbalized understanding of these instructions.  Patient will call clinic if has any signs or symptoms of bleeding or any changes in diet or medications

## 2015-02-25 NOTE — Progress Notes (Signed)
Anticoagulation Management Ronnie Fowler is a 49 y.o. male who reports to the clinic for monitoring of warfarin treatment.    Indication: protein C deficiency and hx of PE Duration: indefinite  Anticoagulation Clinic Visit History: Anticoagulation Episode Summary    Current INR goal 2.0-3.0  Next INR check 04/22/2015  INR from last check 2.20 (02/25/2015)  Weekly max dose   Target end date Indefinite  [facility]  Preferred lab   Send INR reminders to [name]   Indications  Protein C deficiency [facility](HCC) [D68.59]        Comments       Anticoagulation Care Providers    Provider Role Specialty Phone number   Burns SpainElizabeth A Butcher, MD  Internal Medicine [phone]     ASSESSMENT Recent Results: Recent results are below, the most recent result is correlated with a dose of 32.5 mg per week: Lab Results  Component Value Date   INR 2.20 02/25/2015   INR 2.20 02/25/2015   INR 2.2 12/31/2014   Pt denies s/sx of bleeding, medication changes, or diet changes.   INR today: Therapeutic  Anticoagulation Dosing: INR as of 02/25/2015 and Previous Dosing Information    [name]   02/25/2015 2.20 2.0-3.0 32.5 mg 5 mg 5 mg 5 mg 2.5 mg 5 mg 5 mg 5 mg    Anticoagulation Dose Instructions as of 02/25/2015      Total [name]   New Dose 32.5 mg 5 mg 5 mg 5 mg 2.5 mg 5 mg 5 mg 5 mg     (5 mg x 1)  (5 mg x 1)  (5 mg x 1)  (5 mg x 0.5)  (5 mg x 1)  (5 mg x 1)  (5 mg x 1)                         Description        Continue Warfarin 5 mg daily except 2.5 mg on wednesday      PLAN Will continue current dose.  Since pt stable and therapeutic INR x 1 year will continue 8 week INR F/U.  Will consider 12 week f/u at next visit  Patient Instructions  Patient instructed to take medications as defined in the Anti-coagulation Track section of this encounter.  Patient instructed to take today's  dose.  Patient verbalized understanding of these instructions.  Patient will call clinic if has any signs or symptoms of bleeding or any changes in diet or medications     Follow-up Return in about 8 weeks (around 04/22/2015) for f/u INR on 04/22/15 at 4 pm .  Hazle NordmannKelsy Combs, PharmD Pharmacy Resident [phone]  20 minutes spent face-to-face with the patient during the encounter. 50% of time spent on education. 50% of time was spent on assessment and plan.

## 2015-03-02 ENCOUNTER — Ambulatory Visit: Payer: Managed Care, Other (non HMO) | Admitting: Pharmacist

## 2015-04-22 ENCOUNTER — Ambulatory Visit (INDEPENDENT_AMBULATORY_CARE_PROVIDER_SITE_OTHER): Payer: Managed Care, Other (non HMO) | Admitting: Pharmacist

## 2015-04-22 DIAGNOSIS — Z7901 Long term (current) use of anticoagulants: Secondary | ICD-10-CM

## 2015-04-22 DIAGNOSIS — D6859 Other primary thrombophilia: Secondary | ICD-10-CM | POA: Diagnosis not present

## 2015-04-22 LAB — POCT INR: INR: 2.5

## 2015-04-22 NOTE — Patient Instructions (Signed)
Patient educated about medication as defined in this encounter and verbalized understanding by repeating back instructions provided.   

## 2015-04-22 NOTE — Progress Notes (Signed)
Anticoagulation Management Ronnie ShepherdSamuel Fowler is a 49 y.o. male who reports to the clinic for monitoring of warfarin treatment.    Indication: history of PE and protein C deficiency Duration: indefinite  Anticoagulation Clinic Visit History: Patient does not report signs/symptoms of bleeding or thromboembolism Other recent changes: none reported  Anticoagulation Episode Summary    Current INR goal 2.0-3.0  Next INR check 06/17/2015  INR from last check 2.5 (04/22/2015)  Weekly max dose   Target end date Indefinite  [facility]  Preferred lab   Send INR reminders to [name]   Indications  Protein C deficiency [facility](HCC) [D68.59]        Comments       Anticoagulation Care Providers    Provider Role Specialty Phone number   Burns SpainElizabeth A Butcher, MD  Internal Medicine [phone]     ASSESSMENT Recent Results: Recent results are below, the most recent result is correlated with a dose of 32.5 mg per week: Lab Results  Component Value Date   INR 2.5 04/22/2015   INR 2.20 02/25/2015   INR 2.20 02/25/2015   INR today: Therapeutic  Anticoagulation Dosing: INR as of 04/22/2015 and Previous Dosing Information    INR Dt INR Goal [facility] Tot [name]   04/22/2015 2.5 2.0-3.0 32.5 mg 5 mg 5 mg 5 mg 2.5 mg 5 mg 5 mg 5 mg    Previous description        Continue Warfarin 5 mg daily except 2.5 mg on wednesday    Anticoagulation Dose Instructions as of 04/22/2015      Total [name]   New Dose 32.5 mg 5 mg 5 mg 5 mg 2.5 mg 5 mg 5 mg 5 mg     (5 mg x 1)  (5 mg x 1)  (5 mg x 1)  (5 mg x 0.5)  (5 mg x 1)  (5 mg x 1)  (5 mg x 1)                           PLAN Weekly dose was unchanged  Patient Instructions  Patient educated about medication as defined in this encounter and verbalized understanding by repeating back instructions provided.   Follow-up 06/17/15  [name],[name]  15 minutes spent face-to-face with  the patient during the encounter. 75% of time spent on education. 25% of time was spent on assessment and plan.

## 2015-06-17 ENCOUNTER — Ambulatory Visit (INDEPENDENT_AMBULATORY_CARE_PROVIDER_SITE_OTHER): Payer: Managed Care, Other (non HMO) | Admitting: Pharmacist

## 2015-06-17 DIAGNOSIS — Z7901 Long term (current) use of anticoagulants: Secondary | ICD-10-CM | POA: Diagnosis not present

## 2015-06-17 DIAGNOSIS — Z86711 Personal history of pulmonary embolism: Secondary | ICD-10-CM

## 2015-06-17 DIAGNOSIS — D6859 Other primary thrombophilia: Secondary | ICD-10-CM | POA: Diagnosis not present

## 2015-06-17 DIAGNOSIS — I10 Essential (primary) hypertension: Secondary | ICD-10-CM

## 2015-06-17 LAB — POCT INR: INR: 3.3

## 2015-06-17 MED ORDER — AMLODIPINE BESYLATE 10 MG PO TABS: 10.0000 mg | ORAL_TABLET | Freq: Every day | ORAL | Status: DC

## 2015-06-17 MED ORDER — WARFARIN SODIUM 5 MG PO TABS: ORAL_TABLET | ORAL | Status: DC

## 2015-06-17 NOTE — Progress Notes (Signed)
Anticoagulation Management Ronnie Fowler is a 50 y.o. male who reports to the clinic for monitoring of warfarin treatment.    Indication: PE and protein C deficiency Duration: indefinite  Anticoagulation Clinic Visit History: Patient does not report signs/symptoms of bleeding or thromboembolism. He did request refills on warfarin and amlodipine which were both sent to his pharmacy (ok by PCP Dr. Allena Katz)   Anticoagulation Episode Summary    Current INR goal 2.0-3.0  Next INR check 07/04/2015  INR from last check 3.3! (06/17/2015)  Weekly max dose   Target end date Indefinite  [facility]  Preferred lab   Send INR reminders to [name]   Indications  Protein C deficiency [facility]) [D68.59]        Comments       Anticoagulation Care Providers    Provider Role Specialty Phone number   Burns Spain, MD  Internal Medicine [phone]     ASSESSMENT Recent Results: The most recent result is correlated with 32.5 mg per week: Lab Results  Component Value Date   INR 3.3 06/17/2015   INR 2.5 04/22/2015   INR 2.20 02/25/2015   Anticoagulation Dosing: INR as of 06/17/2015 and Previous Dosing Information    INR Dt INR Goal [facility] [name]   06/17/2015 3.3 2.0-3.0 32.5 mg 5 mg 5 mg 5 mg 2.5 mg 5 mg 5 mg 5 mg    Anticoagulation Dose Instructions as of 06/17/2015      Total [name]   New Dose 32.5 mg 5 mg 5 mg 5 mg 2.5 mg 5 mg 5 mg 5 mg     (5 mg x 1)  (5 mg x 1)  (5 mg x 1)  (5 mg x 0.5)  (5 mg x 1)  (5 mg x 1)  (5 mg x 1)                           INR today: Supratherapeutic  PLAN Weekly dose was unchanged due to patient being stable on current dose for at least 1 year and no reported symptoms of concerns. Per Chest guidelines, can maintain dose if INR within 0.5 of therapeutic range and monitor sooner.  Patient Instructions  Patient educated about medication as defined in this encounter and  verbalized understanding by repeating back instructions provided.   Patient advised to contact clinic or seek medical attention if signs/symptoms of bleeding or thromboembolism occur.  Follow-up Return in about 2 weeks (around 07/01/2015).  [name]  15 minutes spent face-to-face with the patient during the encounter. 50% of time spent on education. 50% of time was spent on assessment and plan.

## 2015-06-17 NOTE — Patient Instructions (Signed)
Patient educated about medication as defined in this encounter and verbalized understanding by repeating back instructions provided.   

## 2015-06-21 NOTE — Progress Notes (Signed)
I have reviewed Dr. Elmyra Ricks note.  Patient is on [facility] for protein C deficiency and follow up has been planned.

## 2015-07-01 ENCOUNTER — Ambulatory Visit (INDEPENDENT_AMBULATORY_CARE_PROVIDER_SITE_OTHER): Payer: Managed Care, Other (non HMO) | Admitting: Pharmacist

## 2015-07-01 DIAGNOSIS — D6859 Other primary thrombophilia: Secondary | ICD-10-CM

## 2015-07-01 DIAGNOSIS — Z7901 Long term (current) use of anticoagulants: Secondary | ICD-10-CM

## 2015-07-01 LAB — POCT INR: INR: 2.4

## 2015-07-01 NOTE — Progress Notes (Signed)
Patient ID: Ronnie Fowler, male   DOB: October 23, 1965, 50 y.o.   MRN: [id number] Anticoagulation Management Ronnie Fowler is a 50 y.o. male who reports to the clinic for monitoring of warfarin treatment.    Indication: PE and protein C deficiency Duration: indefinite  Anticoagulation Clinic Visit History: Patient does not report signs/symptoms of bleeding or thromboembolism  Other recent changes: is on day 4 of 5 day tx with Tamiflu  Anticoagulation Episode Summary    Current INR goal 2.0-3.0  Next INR check 07/04/2015  INR from last check 2.4 (08/26/2015)  Weekly max dose   Target end date Indefinite  [facility]  Preferred lab   Send INR reminders to [name]   Indications  Protein C deficiency [facility]) [D68.59]        Comments       Anticoagulation Care Providers    Provider Role Specialty Phone number   Burns Spain, MD  Internal Medicine [phone]     ASSESSMENT Recent Results: The most recent result is correlated with 32.5 mg per week: Lab Results  Component Value Date   INR 2.4 07/01/2015   INR 3.3 06/17/2015   INR 2.5 04/22/2015   INR 2.20 02/25/2015    Anticoagulation Dosing: INR as of 06/17/2015 and Previous Dosing Information    INR Dt INR Goal [facility] [name]   07/01/2015 2.4 2.0-3.0 32.5 mg 5 mg 5 mg 5 mg 2.5 mg 5 mg 5 mg 5 mg    Anticoagulation Dose Instructions as of 06/17/2015      Total [name]   New Dose 32.5 mg 5 mg 5 mg 5 mg 2.5 mg 5 mg 5 mg 5 mg     (5 mg x 1)  (5 mg x 1)  (5 mg x 1)  (5 mg x 0.5)  (5 mg x 1)  (5 mg x 1)  (5 mg x 1)                           INR today: Therapeutic  PLAN Weekly dose was unchanged to 32.5 mg per week  There are no Patient Instructions on file for this visit. Patient advised to contact clinic or seek medical attention if signs/symptoms of bleeding or thromboembolism occur.  Patient verbalized understanding by repeating back  information and was advised to contact me if further medication-related questions arise. Patient was also provided an information handout.  Follow-up August 26, 2015  Ronnie Fowler  20 minutes spent face-to-face with the patient during the encounter. 50% of time spent on education. 50% of time was spent on assessment and plan.

## 2015-07-02 NOTE — Patient Instructions (Signed)
Patient educated about medication as defined in this encounter and verbalized understanding by repeating back instructions provided.   

## 2015-07-02 NOTE — Progress Notes (Signed)
Patient was seen in clinic with Tanya Makhlouf, PharmD candidate. I agree with the assessment and plan of care documented by Tanya. 

## 2015-08-29 ENCOUNTER — Ambulatory Visit (INDEPENDENT_AMBULATORY_CARE_PROVIDER_SITE_OTHER): Payer: Managed Care, Other (non HMO) | Admitting: Pharmacist

## 2015-08-29 DIAGNOSIS — Z86711 Personal history of pulmonary embolism: Secondary | ICD-10-CM

## 2015-08-29 DIAGNOSIS — D6859 Other primary thrombophilia: Secondary | ICD-10-CM

## 2015-08-29 DIAGNOSIS — Z7901 Long term (current) use of anticoagulants: Secondary | ICD-10-CM

## 2015-08-29 LAB — POCT INR: INR: 2.1

## 2015-08-29 MED ORDER — WARFARIN SODIUM 5 MG PO TABS: ORAL_TABLET | ORAL | Status: DC

## 2015-08-29 NOTE — Patient Instructions (Signed)
Patient instructed to take medications as defined in the Anti-coagulation Track section of this encounter.  Patient instructed to take today's dose.  Patient verbalized understanding of these instructions.    

## 2015-08-29 NOTE — Progress Notes (Signed)
Anti-Coagulation Progress Note  Ronnie ShepherdSamuel Fowler is a 50 y.o. male who is currently on an anti-coagulation regimen.    RECENT RESULTS: Recent results are below, the most recent result is correlated with a dose of 32.5 mg. per week: Lab Results  Component Value Date   INR 2.10 08/29/2015   INR 2.4 07/01/2015   INR 3.3 06/17/2015    ANTI-COAG DOSE: Anticoagulation Dose Instructions as of 08/29/2015      [name]   New Dose 5 mg 5 mg 5 mg 5 mg 5 mg 5 mg 5 mg       ANTICOAG SUMMARY: Anticoagulation Episode Summary    Current INR goal 2.0-3.0  Next INR check 10/10/2015  INR from last check 2.10 (08/29/2015)  Weekly max dose   Target end date Indefinite  [facility]  Preferred lab   Send INR reminders to [name]   Indications  Protein C deficiency [facility](HCC) [D68.59]        Comments       Anticoagulation Care Providers    Provider Role Specialty Phone number   Burns SpainElizabeth A Butcher, MD  Internal Medicine [phone]      ANTICOAG TODAY: Anticoagulation Summary as of 08/29/2015    INR goal 2.0-3.0  Selected INR 2.10 (08/29/2015)  Next INR check 10/10/2015  Target end date Indefinite   Indications  Protein C deficiency (HCC) [D68.59]      Anticoagulation Episode Summary    [facility]   Preferred lab    Send INR reminders to [name]   Comments     Anticoagulation Care Providers    Provider Role Specialty Phone number   Burns SpainElizabeth A Butcher, MD  Internal Medicine [phone]      PATIENT INSTRUCTIONS: Patient Instructions  Patient instructed to take medications as defined in the Anti-coagulation Track section of this encounter.  Patient instructed to take today's dose.  Patient verbalized understanding of these instructions.       FOLLOW-UP Return in 6 weeks (on 10/10/2015) for Follow up INR at 4:30PM.  Hulen LusterJames Brit Wernette, [name], [name]

## 2015-09-01 NOTE — Progress Notes (Signed)
I have reviewed Dr. Saralyn PilarGroce's note.  [facility].  INR low normal, coumadin increased.

## 2015-09-29 ENCOUNTER — Telehealth: Payer: Self-pay | Admitting: Internal Medicine

## 2015-09-29 ENCOUNTER — Ambulatory Visit: Payer: Managed Care, Other (non HMO) | Admitting: Internal Medicine

## 2015-09-29 NOTE — Telephone Encounter (Signed)
APT. REMINDER CALL, LMTCB °

## 2015-10-10 ENCOUNTER — Ambulatory Visit (INDEPENDENT_AMBULATORY_CARE_PROVIDER_SITE_OTHER): Payer: Managed Care, Other (non HMO) | Admitting: Pharmacist

## 2015-10-10 DIAGNOSIS — D6859 Other primary thrombophilia: Secondary | ICD-10-CM

## 2015-10-10 DIAGNOSIS — Z7901 Long term (current) use of anticoagulants: Secondary | ICD-10-CM | POA: Diagnosis not present

## 2015-10-10 LAB — POCT INR: INR: 3

## 2015-10-10 NOTE — Progress Notes (Signed)
Anti-Coagulation Progress Note  Ronnie Fowler is a 50 y.o. male who is currently on an anti-coagulation regimen.    RECENT RESULTS: Recent results are below, the most recent result is correlated with a dose of 35 mg. per week: Lab Results  Component Value Date   INR 3.00 10/10/2015   INR 2.10 08/29/2015   INR 2.4 07/01/2015    ANTI-COAG DOSE: Anticoagulation Dose Instructions as of 10/10/2015      [name]   New Dose 5 mg 2.5 mg 5 mg 5 mg 5 mg 5 mg 5 mg       ANTICOAG SUMMARY: Anticoagulation Episode Summary    Current INR goal 2.0-3.0  Next INR check 11/21/2015  INR from last check 3.00 (10/10/2015)  Weekly max dose   Target end date Indefinite  INR check location [facility]  Preferred lab   Send INR reminders to [name]   Indications  Protein C deficiency [facility](HCC) [D68.59]        Comments       Anticoagulation Care Providers    Provider Role Specialty Phone number   Burns SpainElizabeth A Butcher, MD  Internal Medicine [phone]      ANTICOAG TODAY: Anticoagulation Summary as of 10/10/2015    INR goal 2.0-3.0  Selected INR 3.00 (10/10/2015)  Next INR check 11/21/2015  Target end date Indefinite   Indications  Protein C deficiency (HCC) [D68.59]      Anticoagulation Episode Summary    INR check location [facility]   Preferred lab    Send INR reminders to [name]   Comments     Anticoagulation Care Providers    Provider Role Specialty Phone number   Burns SpainElizabeth A Butcher, MD  Internal Medicine [phone]      PATIENT INSTRUCTIONS: Patient Instructions  Patient instructed to take medications as defined in the Anti-coagulation Track section of this encounter.  Patient instructed to take today's dose.  Patient verbalized understanding of these instructions.       FOLLOW-UP Return in 6 weeks (on 11/21/2015) for Follow up INR at 4:30PM.  Ronnie Fowler, [name], [name]

## 2015-10-10 NOTE — Progress Notes (Signed)
INTERNAL MEDICINE TEACHING ATTENDING ADDENDUM - Earl LagosNischal Arlynn Stare M.D  Duration- indefinite, Indication- PE, Protein C deficiency, INR- therapeutic. Agree with pharmacy recommendations as outlined in their note.

## 2015-10-10 NOTE — Patient Instructions (Signed)
Patient instructed to take medications as defined in the Anti-coagulation Track section of this encounter.  Patient instructed to take today's dose.  Patient verbalized understanding of these instructions.    

## 2015-11-21 ENCOUNTER — Ambulatory Visit (INDEPENDENT_AMBULATORY_CARE_PROVIDER_SITE_OTHER): Payer: Managed Care, Other (non HMO) | Admitting: Pharmacist

## 2015-11-21 DIAGNOSIS — D6859 Other primary thrombophilia: Secondary | ICD-10-CM

## 2015-11-21 DIAGNOSIS — Z7901 Long term (current) use of anticoagulants: Secondary | ICD-10-CM

## 2015-11-21 LAB — POCT INR: INR: 3

## 2015-11-21 NOTE — Patient Instructions (Signed)
Patient instructed to take medications as defined in the Anti-coagulation Track section of this encounter.  Patient instructed to take today's dose.  Patient verbalized understanding of these instructions.    

## 2015-11-21 NOTE — Progress Notes (Signed)
Anti-Coagulation Progress Note  Ronnie ShepherdSamuel Fowler is a 50 y.o. male who is currently on an anti-coagulation regimen.    RECENT RESULTS: Recent results are below, the most recent result is correlated with a dose of 32.5 mg. per week: Lab Results  Component Value Date   INR 3.0 11/21/2015   INR 3.00 10/10/2015   INR 2.10 08/29/2015    ANTI-COAG DOSE: Anticoagulation Dose Instructions as of 11/21/2015      [name]   New Dose 5 mg 2.5 mg 5 mg 5 mg 2.5 mg 5 mg 5 mg       ANTICOAG SUMMARY: Anticoagulation Episode Summary    Current INR goal 2.0-3.0  Next INR check 01/02/2016  INR from last check 3.0 (11/21/2015)  Weekly max dose   Target end date Indefinite  [facility] check location [facility]  Preferred lab   Send INR reminders to [name]   Indications  Protein C deficiency [name](HCC) [D68.59]        Comments       Anticoagulation Care Providers    Provider Role Specialty Phone number   Burns SpainElizabeth A Butcher, MD  Internal Medicine [phone]      ANTICOAG TODAY: Anticoagulation Summary as of 11/21/2015    INR goal 2.0-3.0  Selected INR 3.0 (11/21/2015)  Next INR check 01/02/2016  Target end date Indefinite   Indications  Protein C deficiency (HCC) [D68.59]      Anticoagulation Episode Summary    INR check location [facility]   Preferred lab    Send INR reminders to [name]   Comments     Anticoagulation Care Providers    Provider Role Specialty Phone number   Burns SpainElizabeth A Butcher, MD  Internal Medicine [phone]      PATIENT INSTRUCTIONS: Patient Instructions  Patient instructed to take medications as defined in the Anti-coagulation Track section of this encounter.  Patient instructed to take today's dose.  Patient verbalized understanding of these instructions.       FOLLOW-UP Return in 6 weeks (on 01/02/2016) for Follow up INR at 4:15PM.  Hulen LusterJames Sharmain Lastra, [name], [name]

## 2015-12-08 IMAGING — CR DG CERVICAL SPINE 2 OR 3 VIEWS
2 series · 2 of 2 positions shown · non-contrast
Comparison: None.

CLINICAL DATA: Neck pain.

EXAM:
CERVICAL SPINE - 2-3 VIEW

[lateral]
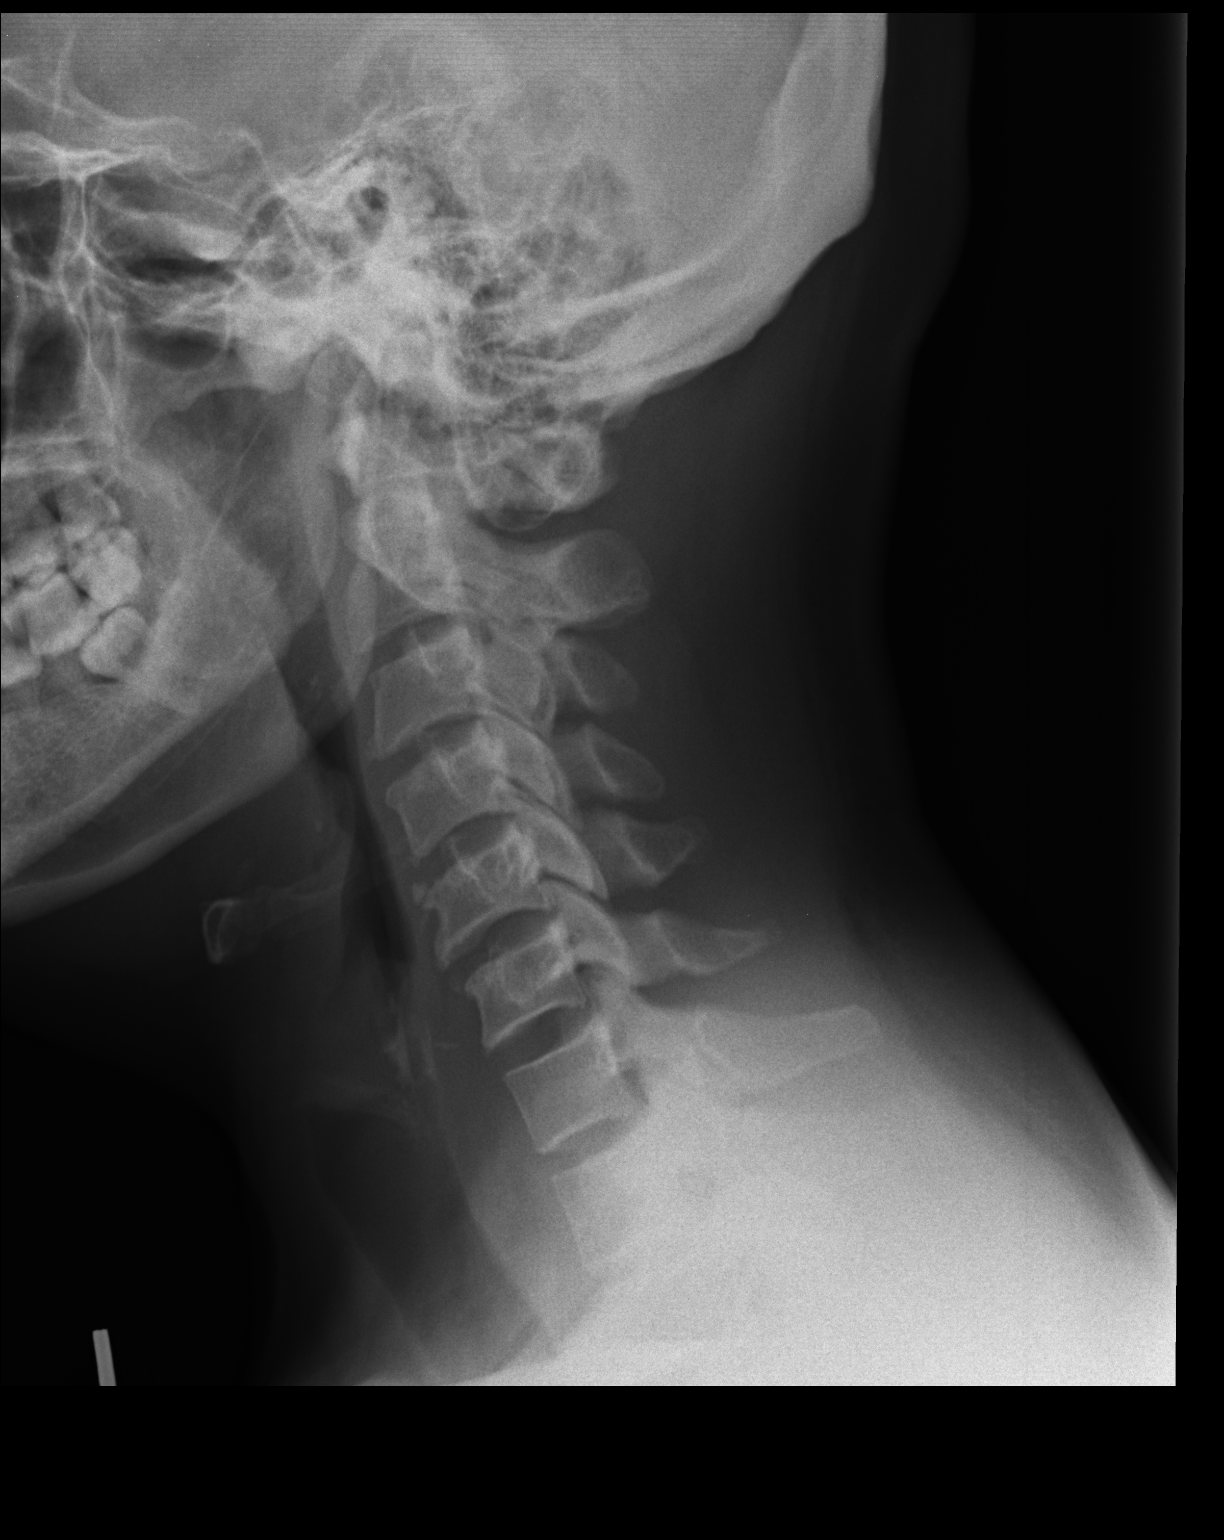

[AP]
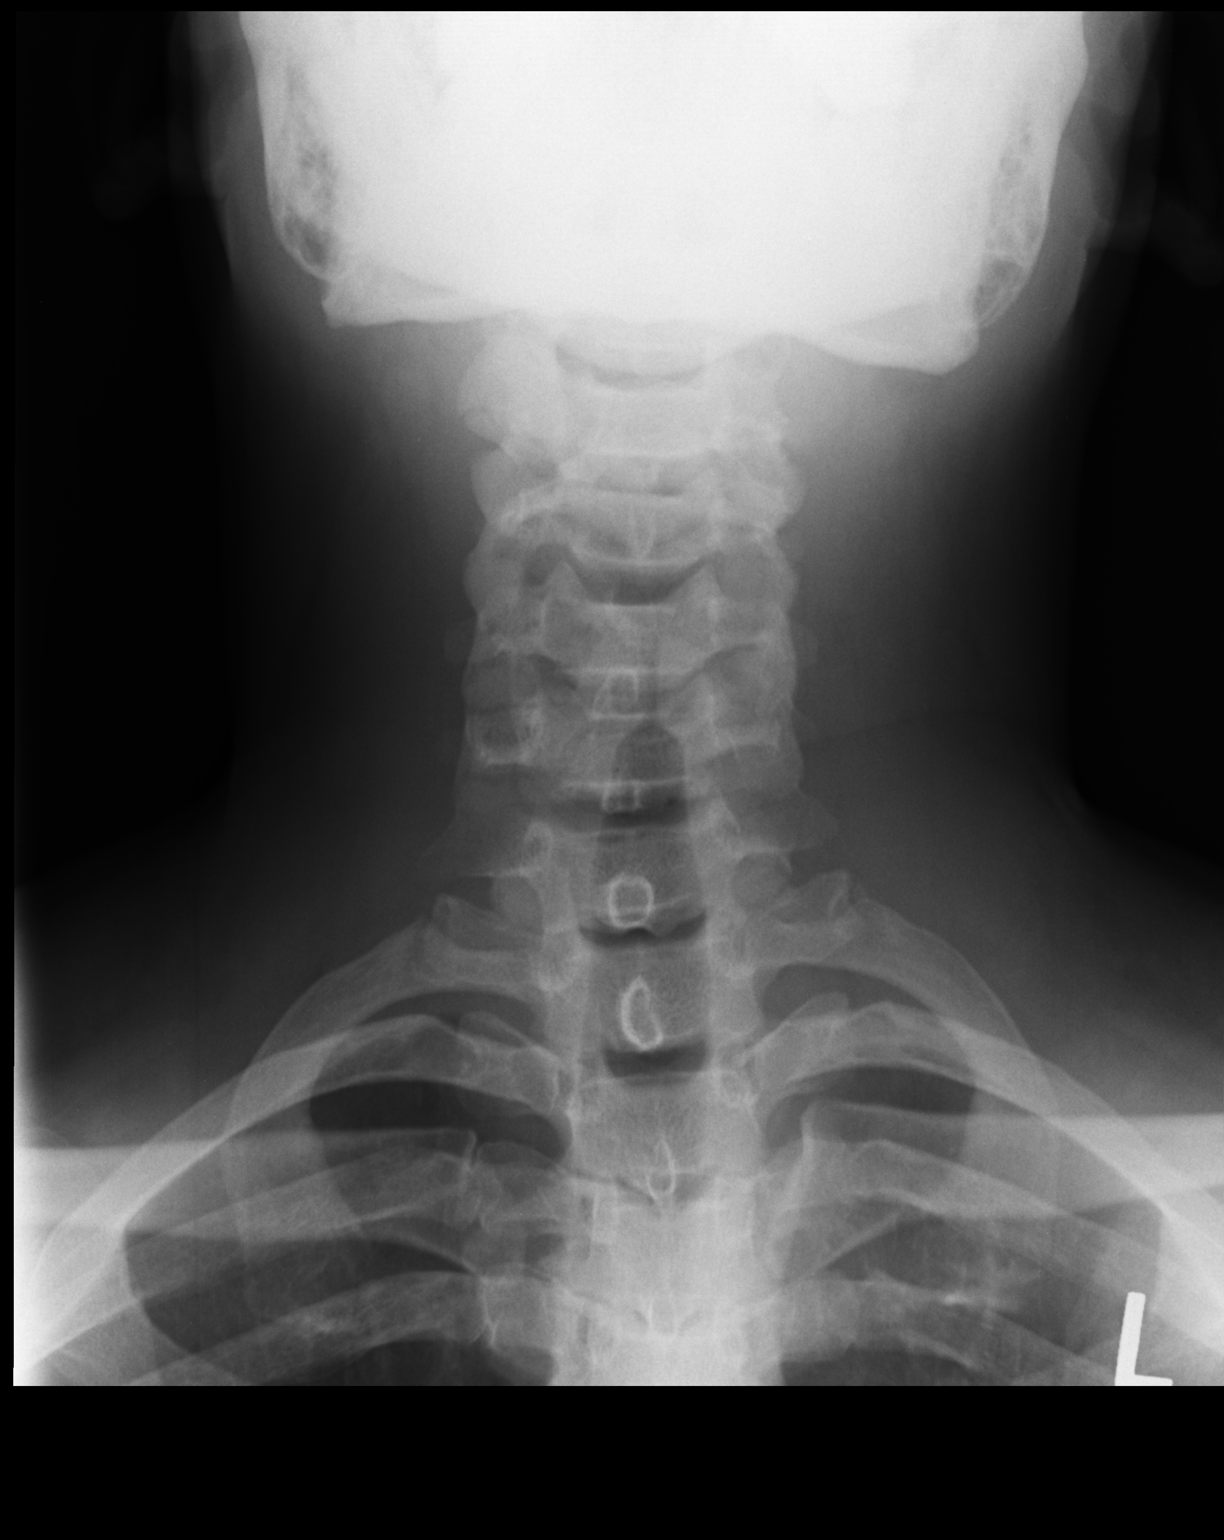

[2 of 2 positions shown; findings below may reference images not displayed]

FINDINGS: Straightening of the normal cervical lordosis. No spondylolisthesis.
Prevertebral soft tissues are normal. Cervicothoracic junction is
normal. Mild cervical spondylosis is present at C4-C5 and C5-C6.
Anterior annular calcification present.
IMPRESSION: Mild cervical spondylosis, most pronounced at C4-C5 and C5-C6. No
acute osseous abnormality.

## 2015-12-08 IMAGING — CR DG SHOULDER 2+V*R*
3 series · 3 of 3 positions shown · non-contrast
Comparison: None.

CLINICAL DATA: RIGHT shoulder and right-sided neck pain.

EXAM:
RIGHT SHOULDER - 2+ VIEW

[AP]
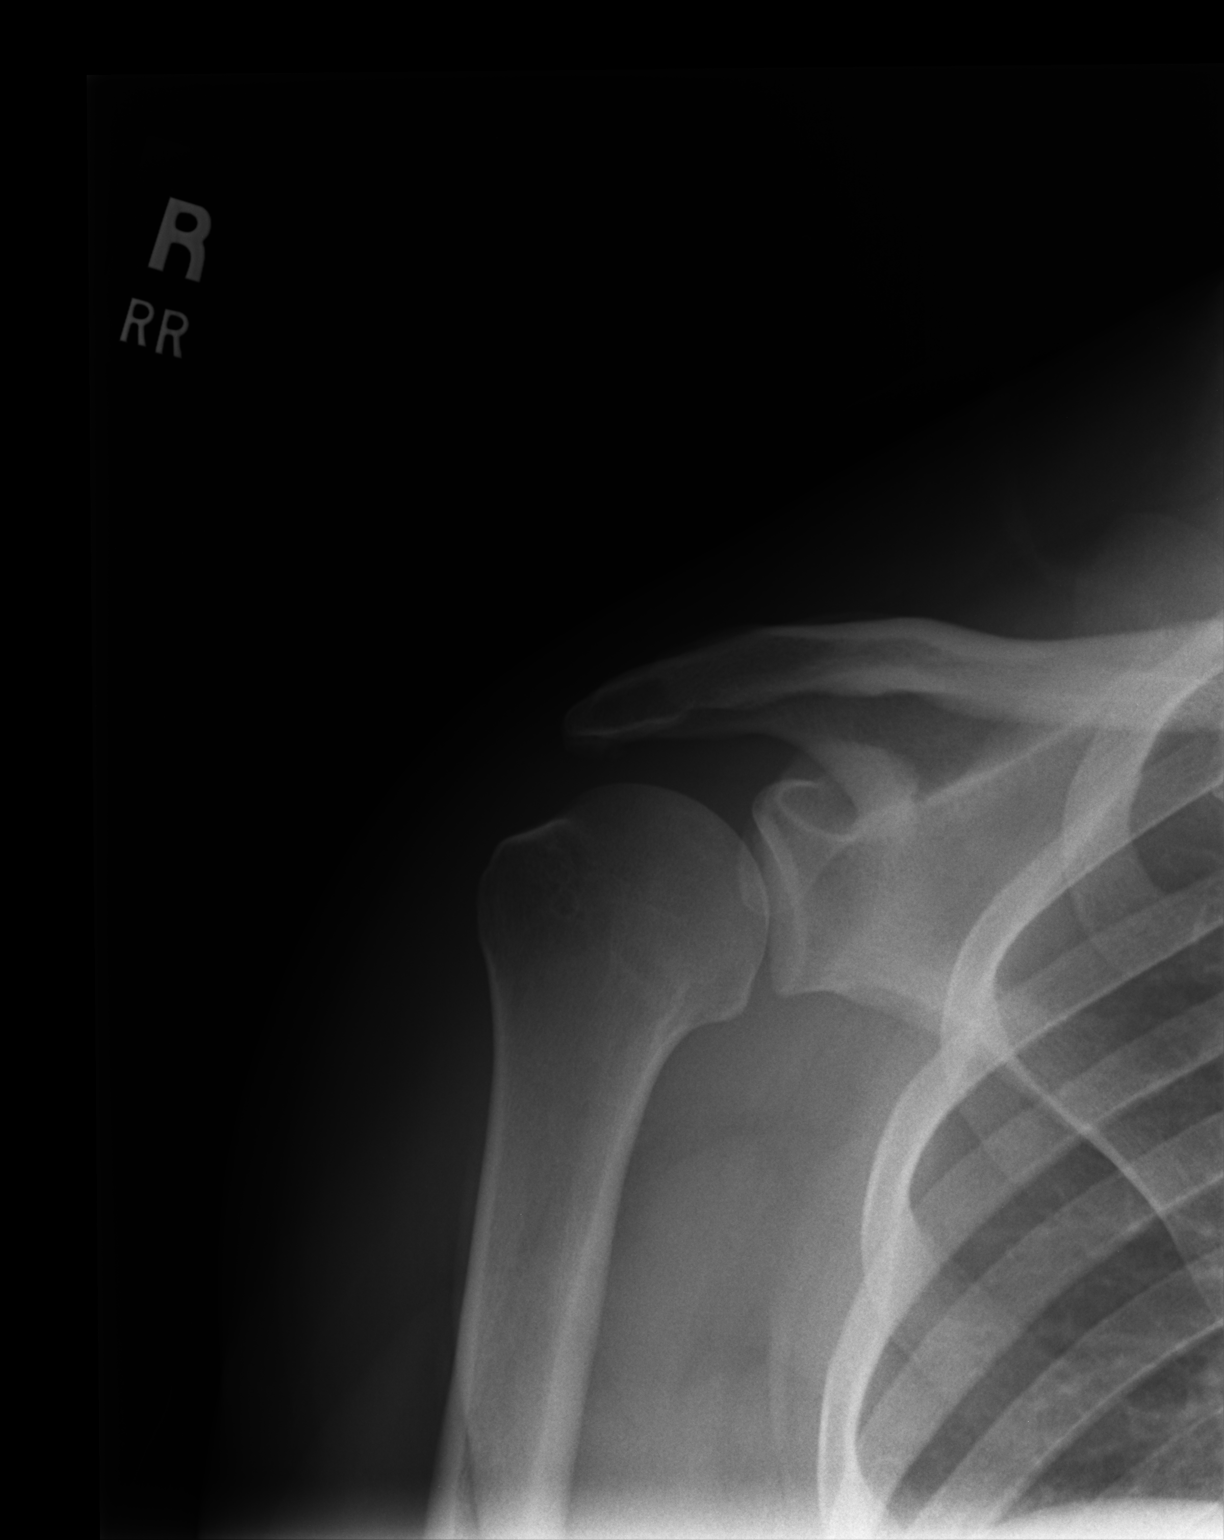

[pa y view]
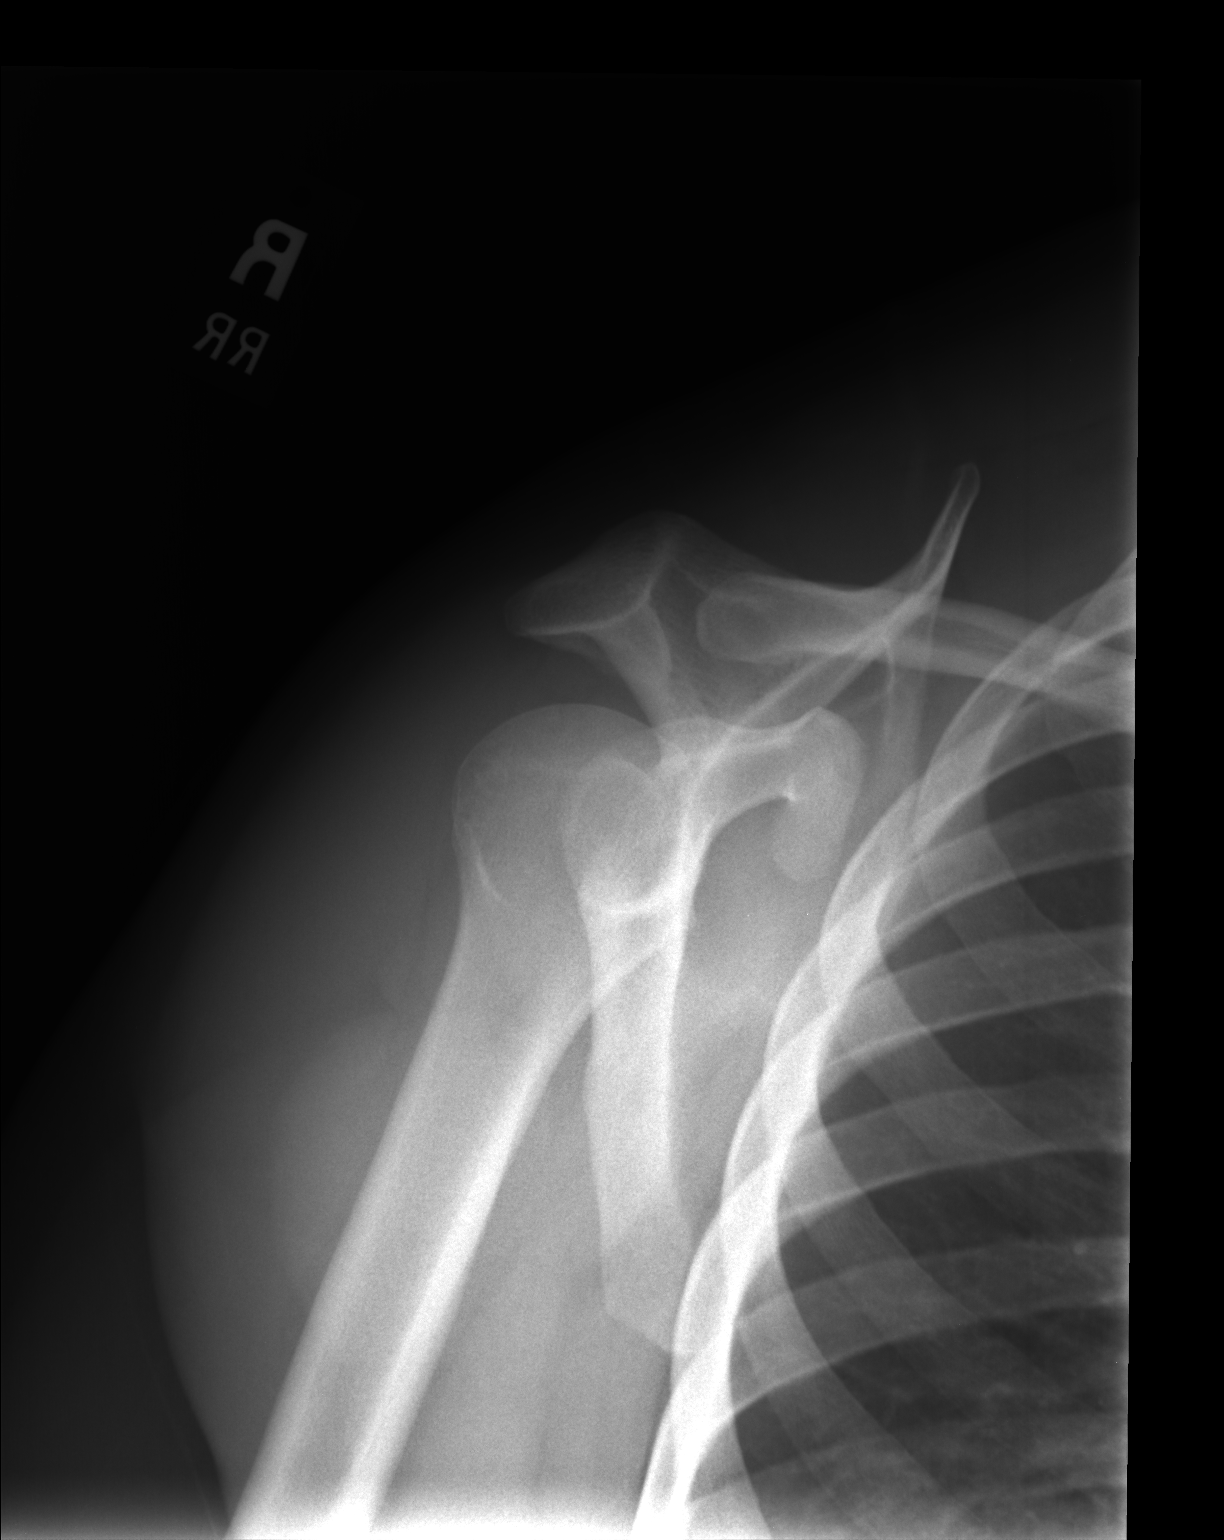

[axial]
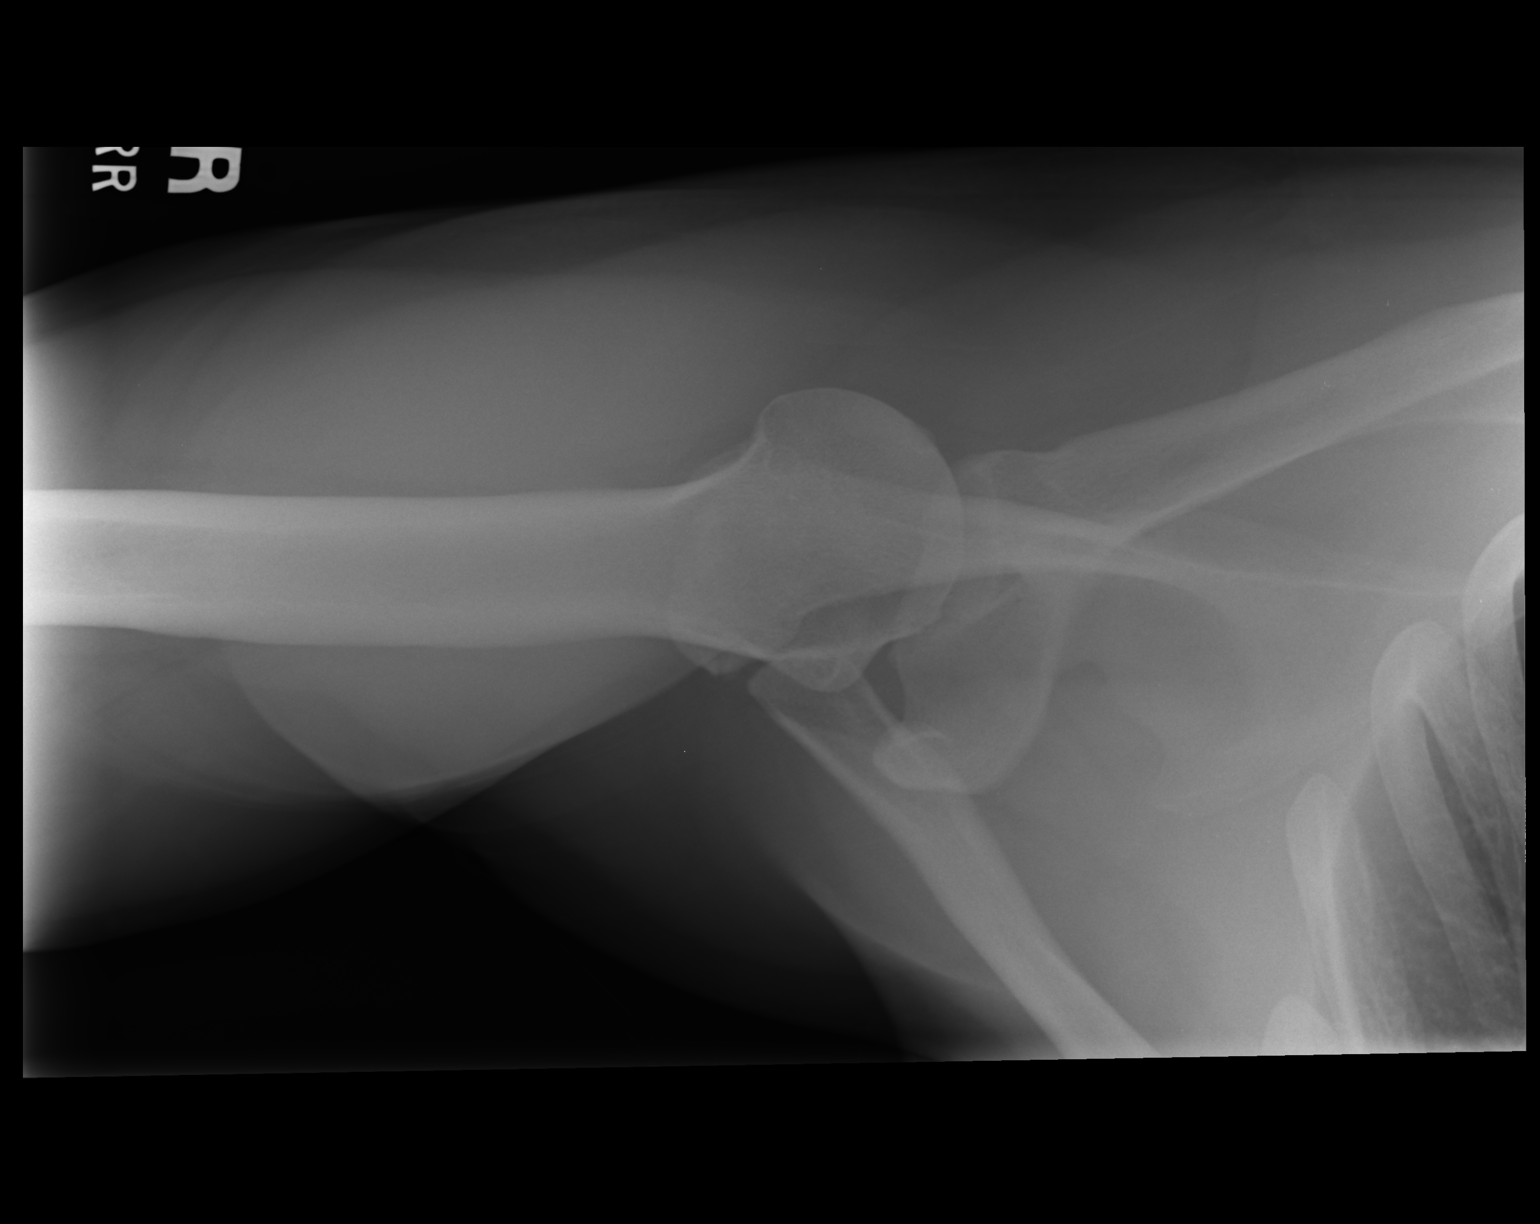

[3 of 3 positions shown; findings below may reference images not displayed]

FINDINGS: There is no evidence of fracture or dislocation. There is no
evidence of arthropathy or other focal bone abnormality. Soft
tissues are unremarkable.
IMPRESSION: Negative.

## 2015-12-29 ENCOUNTER — Telehealth: Payer: Self-pay | Admitting: Internal Medicine

## 2015-12-29 NOTE — Telephone Encounter (Signed)
T. REMINDER CALL, LMTCB °

## 2015-12-30 ENCOUNTER — Encounter: Payer: Self-pay | Admitting: Internal Medicine

## 2015-12-30 ENCOUNTER — Ambulatory Visit (INDEPENDENT_AMBULATORY_CARE_PROVIDER_SITE_OTHER): Payer: Managed Care, Other (non HMO) | Admitting: Internal Medicine

## 2015-12-30 ENCOUNTER — Ambulatory Visit (INDEPENDENT_AMBULATORY_CARE_PROVIDER_SITE_OTHER): Payer: Managed Care, Other (non HMO) | Admitting: Pharmacist

## 2015-12-30 VITALS — BP 157/95 | HR 57 | Temp 97.5°F | Ht 68.0 in | Wt 192.7 lb

## 2015-12-30 DIAGNOSIS — D6859 Other primary thrombophilia: Secondary | ICD-10-CM

## 2015-12-30 DIAGNOSIS — Z79899 Other long term (current) drug therapy: Secondary | ICD-10-CM

## 2015-12-30 DIAGNOSIS — Z7901 Long term (current) use of anticoagulants: Secondary | ICD-10-CM | POA: Diagnosis not present

## 2015-12-30 DIAGNOSIS — M25512 Pain in left shoulder: Secondary | ICD-10-CM | POA: Diagnosis not present

## 2015-12-30 DIAGNOSIS — M25511 Pain in right shoulder: Secondary | ICD-10-CM | POA: Insufficient documentation

## 2015-12-30 DIAGNOSIS — I10 Essential (primary) hypertension: Secondary | ICD-10-CM

## 2015-12-30 LAB — POCT INR: INR: 2.4

## 2015-12-30 MED ORDER — MELOXICAM 7.5 MG PO TABS: 7.5000 mg | ORAL_TABLET | Freq: Every day | ORAL | 0 refills | Status: AC | PRN

## 2015-12-30 NOTE — Progress Notes (Signed)
   CC: hypertension  HPI:  [name] is a 50 y.o. male who presents today for hypertension. Please see assessment & plan for status of chronic medical problems.   Past Medical History:  Diagnosis Date  . HTN (hypertension)   . Lupus anticoagulant positive   . PE (pulmonary embolism) 03/2002; 05/2005  . Protein C deficiency (HCC)     Review of Systems:  Please see each problem below for a pertinent review of systems.  Physical Exam:  Vitals:   12/30/15 1622 12/30/15 1651  BP: (!) 169/89 (!) 157/95  Pulse: 62 (!) 57  Temp: 97.5 F (36.4 C)   TempSrc: Oral   SpO2: 100%   Weight: 192 lb 11.2 oz (87.4 kg)   Height: 5\' 8"  (1.727 m)    Physical Exam  Constitutional: He is oriented to person, place, and time. No distress.  HENT:  Head: Normocephalic and atraumatic.  Eyes: Conjunctivae are normal. No scleral icterus.  Neck: Normal range of motion.  Cardiovascular: Normal rate and regular rhythm.   Pulmonary/Chest: Effort normal. No respiratory distress.  Lymphadenopathy:    He has no cervical adenopathy.  Neurological: He is alert and oriented to person, place, and time.  Skin: Skin is warm and dry. He is not diaphoretic.       Assessment & Plan:   See Encounters Tab for problem based charting.  Patient discussed with Dr. Rogelia BogaButcher

## 2015-12-30 NOTE — Patient Instructions (Signed)
Patient instructed to take medications as defined in the Anti-coagulation Track section of this encounter.  Patient instructed to take today's dose.  Patient verbalized understanding of these instructions.    

## 2015-12-30 NOTE — Patient Instructions (Addendum)
Please take meloxicam 1 tablet as needed for your shoulder pain. If you notice any bleeding, change in your stool, stomach pain, please stop taking this medicine and give us a call back.  For your blood pressure, please take amlodipine 10mg  every day.  If you have no problems, let's see you back in 1 year.

## 2015-12-30 NOTE — Assessment & Plan Note (Signed)
Assessment His blood pressure was initially elevated at 169/89 though mildly improved to 157/95. He acknowledges he was stressed rushing to his appointment today as he was late. He acknowledges adherence to amlodipine 10 mg though tells me he takes it 3 times weekly because he feels it makes him void more frequently. He otherwise denies signs and symptoms of hypertensive urgency, like chest pain, difficulty breathing, headache, blurry vision.  Like last year, his blood pressure remains poorly controlled with goal 140/90 given his age and other comorbidities.  Plan -Counseled the patient on the mechanism of amlodipine as a vasodilatory medication versus other agents which may act more like a diuretic and would therefore increase urinary frequency -Confirmed he understood with teach back method -Continue amlodipine 10 mg daily

## 2015-12-30 NOTE — Assessment & Plan Note (Signed)
Assessment About once a week, he has noticed shoulder pain bilaterally. He has attempted to take acetaminophen 2 tablets without relief though finds relief with ibuprofen 500 mg. He is wondering if he can be prescribed some kind of medication for when his work become stressful. 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  Given his chronic anticoagulation, I'm leery of prescribing NSAIDs though it would be first line for the kind of pain he is describing.  Plan -Prescribed meloxicam 7.5 mg 30 tablets to be taken once daily as needed for shoulder pain -Counseled the patient on the risks of NSAID therapy with regards to bleeding and to monitor for signs of his complication with confirmation by verbal teach back method

## 2015-12-30 NOTE — Progress Notes (Signed)
I have reviewed Dr. Saralyn PilarGroce's note.  [facility] and INR is at goal.

## 2015-12-30 NOTE — Progress Notes (Signed)
Anti-Coagulation Progress Note  Ronnie Fowler is a 50 y.o. male who is currently on an anti-coagulation regimen.    RECENT RESULTS: Recent results are below, the most recent result is correlated with a dose of 30 mg. per week: Lab Results  Component Value Date   INR 2.40 12/30/2015   INR 3.0 11/21/2015   INR 3.00 10/10/2015    ANTI-COAG DOSE: Anticoagulation Dose Instructions as of 12/30/2015      [name]   New Dose 5 mg 5 mg 5 mg 5 mg 2.5 mg 5 mg 5 mg       ANTICOAG SUMMARY: Anticoagulation Episode Summary    Current INR goal:   2.0-3.0  TTR:   93.6 % (5.5 y)  Next INR check:   02/06/2016  INR from last check:   2.40 (12/30/2015)  Weekly max dose:     Target end date:   Indefinite  INR check location:   [facility]  Preferred lab:     Send INR reminders to:   [name]   Indications   Protein C deficiency [facility](HCC) [D68.59]       Comments:         Anticoagulation Care Providers    Provider Role Specialty Phone number   Burns SpainElizabeth A Butcher, MD  Internal Medicine [phone]      ANTICOAG TODAY: Anticoagulation Summary  As of 12/30/2015   INR goal:   2.0-3.0  TTR:     Today's INR:   2.40  Next INR check:   02/06/2016  Target end date:   Indefinite   Indications   Protein C deficiency (HCC) [D68.59]        Anticoagulation Episode Summary    INR check location:   [facility]   Preferred lab:      Send INR reminders to:   [name]   Comments:       Anticoagulation Care Providers    Provider Role Specialty Phone number   Burns SpainElizabeth A Butcher, MD  Internal Medicine [phone]      PATIENT INSTRUCTIONS: There are no Patient Instructions on file for this visit.   FOLLOW-UP Return in 5 weeks (on 02/06/2016) for Follow up INR at 4:15PM.  Hulen LusterJames Valarie Farace, [name], [name]

## 2016-01-02 ENCOUNTER — Ambulatory Visit: Payer: Managed Care, Other (non HMO)

## 2016-01-02 NOTE — Progress Notes (Signed)
Internal Medicine Clinic Attending  Case discussed with Dr. Patel,Rushil soon after the resident saw the patient.  We reviewed the resident's history and exam and pertinent patient test results.  I agree with the assessment, diagnosis, and plan of care documented in the resident's note. 

## 2016-02-06 ENCOUNTER — Ambulatory Visit (INDEPENDENT_AMBULATORY_CARE_PROVIDER_SITE_OTHER): Payer: Managed Care, Other (non HMO) | Admitting: Pharmacist

## 2016-02-06 DIAGNOSIS — D6859 Other primary thrombophilia: Secondary | ICD-10-CM | POA: Diagnosis not present

## 2016-02-06 DIAGNOSIS — Z7901 Long term (current) use of anticoagulants: Secondary | ICD-10-CM

## 2016-02-06 LAB — POCT INR: INR: 2.8

## 2016-02-06 NOTE — Patient Instructions (Signed)
Patient instructed to take medications as defined in the Anti-coagulation Track section of this encounter.  Patient instructed to take today's dose.  Patient verbalized understanding of these instructions.    

## 2016-02-06 NOTE — Progress Notes (Signed)
Anti-Coagulation Progress Note  Ronnie Fowler is a 50 y.o. male who is currently on an anti-coagulation regimen.    RECENT RESULTS: Recent results are below, the most recent result is correlated with a dose of 32.5 mg. per week: Lab Results  Component Value Date   INR 2.80 02/06/2016   INR 2.40 12/30/2015   INR 3.0 11/21/2015    ANTI-COAG DOSE: Anticoagulation Dose Instructions as of 02/06/2016      [name]   New Dose 5 mg 5 mg 5 mg 5 mg 2.5 mg 5 mg 5 mg       ANTICOAG SUMMARY: Anticoagulation Episode Summary    Current INR goal:   2.0-3.0  TTR:   93.8 % (5.6 y)  Next INR check:   03/19/2016  INR from last check:   2.80 (02/06/2016)  Weekly max dose:     Target end date:   Indefinite  [facility]R check location:   [facility]  Preferred lab:     Send INR reminders to:   [name]   Indications   Protein C deficiency [facility](HCC) [D68.59]       Comments:         Anticoagulation Care Providers    Provider Role Specialty Phone number   Burns SpainElizabeth A Butcher, MD  Internal Medicine [phone]      ANTICOAG TODAY: Anticoagulation Summary  As of 02/06/2016   INR goal:   2.0-3.0  TTR:     Today's INR:   2.80  Next INR check:   03/19/2016  Target end date:   Indefinite   Indications   Protein C deficiency (HCC) [D68.59]        Anticoagulation Episode Summary    INR check location:   [facility]   Preferred lab:      Send INR reminders to:   [name]   Comments:       Anticoagulation Care Providers    Provider Role Specialty Phone number   Burns SpainElizabeth A Butcher, MD  Internal Medicine [phone]      PATIENT INSTRUCTIONS: There are no Patient Instructions on file for this visit.   FOLLOW-UP Return in 6 weeks (on 03/19/2016) for Follow up INR at 1630h.  Hulen LusterJames Jamie Hafford, [name], [name]

## 2016-03-03 ENCOUNTER — Other Ambulatory Visit: Payer: Self-pay | Admitting: Family Medicine

## 2016-03-03 DIAGNOSIS — H60392 Other infective otitis externa, left ear: Secondary | ICD-10-CM

## 2016-03-19 ENCOUNTER — Ambulatory Visit (INDEPENDENT_AMBULATORY_CARE_PROVIDER_SITE_OTHER): Payer: Managed Care, Other (non HMO) | Admitting: Pharmacist

## 2016-03-19 DIAGNOSIS — Z7901 Long term (current) use of anticoagulants: Secondary | ICD-10-CM | POA: Diagnosis not present

## 2016-03-19 DIAGNOSIS — D6859 Other primary thrombophilia: Secondary | ICD-10-CM | POA: Diagnosis not present

## 2016-03-19 LAB — POCT INR: INR: 2.9

## 2016-03-19 NOTE — Patient Instructions (Signed)
Patient instructed to take medications as defined in the Anti-coagulation Track section of this encounter.  Patient instructed to take today's dose.  Patient verbalized understanding of these instructions.    

## 2016-03-19 NOTE — Progress Notes (Signed)
Anti-Coagulation Progress Note  Ronnie ShepherdSamuel Fowler is a 50 y.o. male who is currently on an anti-coagulation regimen.    RECENT RESULTS: Recent results are below, the most recent result is correlated with a dose of 32.5 mg. per week: Lab Results  Component Value Date   INR 2.9 03/19/2016   INR 2.80 02/06/2016   INR 2.40 12/30/2015    ANTI-COAG DOSE: Anticoagulation Dose Instructions as of 03/19/2016      [name]   New Dose 5 mg 5 mg 5 mg 5 mg 2.5 mg 5 mg 5 mg       ANTICOAG SUMMARY: Anticoagulation Episode Summary    Current INR goal:   2.0-3.0  TTR:   93.9 % (5.7 y)  Next INR check:   05/14/2016  INR from last check:     Weekly max dose:     Target end date:   Indefinite  INR check location:   [facility]  Preferred lab:     Send INR reminders to:   [name]   Indications   Protein C deficiency [facility](HCC) [D68.59]       Comments:         Anticoagulation Care Providers    Provider Role Specialty Phone number   Burns SpainElizabeth A Butcher, MD  Internal Medicine [phone]      ANTICOAG TODAY: Anticoagulation Summary  As of 03/19/2016   INR goal:   2.0-3.0  TTR:     Today's INR:     Next INR check:   05/14/2016  Target end date:   Indefinite   Indications   Protein C deficiency (HCC) [D68.59]        Anticoagulation Episode Summary    INR check location:   [facility]   Preferred lab:      Send INR reminders to:   [name]   Comments:       Anticoagulation Care Providers    Provider Role Specialty Phone number   Burns SpainElizabeth A Butcher, MD  Internal Medicine [phone]      PATIENT INSTRUCTIONS: Patient Instructions  Patient instructed to take medications as defined in the Anti-coagulation Track section of this encounter.  Patient instructed to take today's dose.  Patient verbalized understanding of these instructions.       FOLLOW-UP Return for INR Follow Up 05/15/15 at 4:30PM .  Carylon PerchesMaggie Shuda, PharmD Acute Care Pharmacy  Resident  Pager: [phone] 03/19/2016

## 2016-03-20 NOTE — Progress Notes (Signed)
Indication: Protein C deficiency. Duration: Indefinite. INR: At target. Agree with Dr. Kingsley PlanShuda's assessment and plan.

## 2016-05-14 ENCOUNTER — Ambulatory Visit (INDEPENDENT_AMBULATORY_CARE_PROVIDER_SITE_OTHER): Payer: Managed Care, Other (non HMO) | Admitting: Pharmacist

## 2016-05-14 DIAGNOSIS — D6859 Other primary thrombophilia: Secondary | ICD-10-CM

## 2016-05-14 DIAGNOSIS — Z7901 Long term (current) use of anticoagulants: Secondary | ICD-10-CM | POA: Diagnosis not present

## 2016-05-14 LAB — POCT INR: INR: 3.3

## 2016-05-14 NOTE — Progress Notes (Signed)
Anti-Coagulation Progress Note  Ronnie Fowler is a 51 y.o. male who is currently on an anti-coagulation regimen.    RECENT RESULTS: Recent results are below, the most recent result is correlated with a dose of 32.5 mg. per week: Lab Results  Component Value Date   INR 3.30 05/14/2016   INR 2.9 03/19/2016   INR 2.80 02/06/2016    ANTI-COAG DOSE: Anticoagulation Dose Instructions as of 05/14/2016      [name]   New Dose 5 mg 2.5 mg 5 mg 5 mg 2.5 mg 5 mg 5 mg       ANTICOAG SUMMARY: Anticoagulation Episode Summary    Current INR goal:   2.0-3.0  TTR:   92.1 % (5.9 y)  Next INR check:   06/11/2016  INR from last check:   3.30! (05/14/2016)  Weekly max dose:     Target end date:   Indefinite  [facility] check location:   [facility]  Preferred lab:     Send INR reminders to:   [name]   Indications   Protein C deficiency [facility](HCC) [D68.59]       Comments:         Anticoagulation Care Providers    Provider Role Specialty Phone number   Burns SpainElizabeth A Butcher, MD  Internal Medicine [phone]      ANTICOAG TODAY: Anticoagulation Summary  As of 05/14/2016   INR goal:   2.0-3.0  TTR:     Today's INR:   3.30!  Next INR check:   06/11/2016  Target end date:   Indefinite   Indications   Protein C deficiency (HCC) [D68.59]        Anticoagulation Episode Summary    [facility]R check location:   [facility]   Preferred lab:      Send INR reminders to:   [name]   Comments:       Anticoagulation Care Providers    Provider Role Specialty Phone number   Burns SpainElizabeth A Butcher, MD  Internal Medicine [phone]      Patient instructed to take medications as defined in the Anti-coagulation Track section of this encounter.  Patient instructed to take today's dose.  Patient instructed to take 1/2 tablet on Mondays and Thursdays, 1 tablet all other days of your warfarin 5mg  peach colored tablets.  Patient verbalized understanding of these instructions.     PATIENT INSTRUCTIONS:   FOLLOW-UP Return in 4 weeks (on 06/11/2016) for Follow up INR at 4:30.  Hulen LusterJames Teaghan Melrose, [name], [name]

## 2016-05-14 NOTE — Patient Instructions (Signed)
Patient instructed to take medications as defined in the Anti-coagulation Track section of this encounter.  Patient instructed to take today's dose.  Patient instructed to take 1/2 tablet on Mondays and Thursdays, 1 tablet all other days of your warfarin 5mg  peach colored tablets.  Patient verbalized understanding of these instructions.

## 2016-05-15 NOTE — Progress Notes (Signed)
INTERNAL MEDICINE TEACHING ATTENDING ADDENDUM - Gust RungErik C Hoffman, DO Duration- indefinate, Indication- recurrent VTE, protein C def, INR-  Lab Results  Component Value Date   INR 3.30 05/14/2016  . Agree with pharmacy recommendations as outlined in their note.

## 2016-06-11 ENCOUNTER — Ambulatory Visit (INDEPENDENT_AMBULATORY_CARE_PROVIDER_SITE_OTHER): Payer: Managed Care, Other (non HMO) | Admitting: Pharmacist

## 2016-06-11 DIAGNOSIS — D6859 Other primary thrombophilia: Secondary | ICD-10-CM

## 2016-06-11 DIAGNOSIS — Z7901 Long term (current) use of anticoagulants: Secondary | ICD-10-CM

## 2016-06-11 LAB — POCT INR: INR: 2.4

## 2016-06-11 NOTE — Patient Instructions (Signed)
Patient instructed to take medications as defined in the Anti-coagulation Track section of this encounter.  Patient instructed to take today's dose.  Patient instructed to take 1/2 tablet on Mondays and Thursdays--all other days, take 1 tablet.  Patient verbalized understanding of these instructions.

## 2016-06-11 NOTE — Progress Notes (Signed)
Anti-Coagulation Progress Note  Ronnie ShepherdSamuel Fowler is a 51 y.o. male who is currently on an anti-coagulation regimen.    RECENT RESULTS: Recent results are below, the most recent result is correlated with a dose of 30 mg. per week: Lab Results  Component Value Date   INR 2.40 06/11/2016   INR 3.30 05/14/2016   INR 2.9 03/19/2016    ANTI-COAG DOSE: Anticoagulation Dose Instructions as of 06/11/2016      [name]   New Dose 5 mg 2.5 mg 5 mg 5 mg 2.5 mg 5 mg 5 mg       ANTICOAG SUMMARY: Anticoagulation Episode Summary    Current INR goal:   2.0-3.0  TTR:   91.8 % (6 y)  Next INR check:   07/09/2016  INR from last check:   2.40 (06/11/2016)  Weekly max dose:     Target end date:   Indefinite  [facility]R check location:   [facility]  Preferred lab:     Send INR reminders to:   [name]   Indications   Protein C deficiency [facility](HCC) [D68.59]       Comments:         Anticoagulation Care Providers    Provider Role Specialty Phone number   Burns SpainElizabeth A Butcher, MD  Internal Medicine [phone]      ANTICOAG TODAY: Anticoagulation Summary  As of 06/11/2016   INR goal:   2.0-3.0  TTR:     Today's INR:   2.40  Next INR check:   07/09/2016  Target end date:   Indefinite   Indications   Protein C deficiency (HCC) [D68.59]        Anticoagulation Episode Summary    INR check location:   [facility]   Preferred lab:      Send INR reminders to:   [name]   Comments:       Anticoagulation Care Providers    Provider Role Specialty Phone number   Burns SpainElizabeth A Butcher, MD  Internal Medicine [phone]      PATIENT INSTRUCTIONS: Patient instructed to take medications as defined in the Anti-coagulation Track section of this encounter.  Patient instructed to take today's dose.  Patient instructed to take 1/2 tablet on Mondays and Thursdays--all other days, take 1 tablet.  Patient verbalized understanding of these instructions.      FOLLOW-UP Return  in 4 weeks (on 07/09/2016) for Follow up INR at 4:30PM.  Hulen LusterJames Raysean Graumann, [name], [name]

## 2016-06-12 NOTE — Progress Notes (Signed)
INTERNAL MEDICINE TEACHING ATTENDING ADDENDUM - Earl LagosNischal Camaryn Lumbert M.D  Duration- indefinite, Indication- recurrent PE, protein C def, INR- therapeutic. Agree with pharmacy recommendations as outlined in their note.

## 2016-06-18 ENCOUNTER — Other Ambulatory Visit: Payer: Self-pay | Admitting: Internal Medicine

## 2016-06-18 DIAGNOSIS — I10 Essential (primary) hypertension: Secondary | ICD-10-CM

## 2016-06-18 NOTE — Telephone Encounter (Signed)
As this patient was seen by me and deemed necessary for their treatment, I will refill this prescription for amlodipine.  

## 2016-07-09 ENCOUNTER — Ambulatory Visit (INDEPENDENT_AMBULATORY_CARE_PROVIDER_SITE_OTHER): Payer: Managed Care, Other (non HMO) | Admitting: Pharmacist

## 2016-07-09 DIAGNOSIS — Z7901 Long term (current) use of anticoagulants: Secondary | ICD-10-CM

## 2016-07-09 DIAGNOSIS — D6859 Other primary thrombophilia: Secondary | ICD-10-CM

## 2016-07-09 LAB — POCT INR: INR: 2.3

## 2016-07-10 NOTE — Progress Notes (Signed)
Anti-Coagulation Progress Note  [name] is a 51 y.o. male who is currently on an anti-coagulation regimen.    RECENT RESULTS: Recent results are below, the most recent result is correlated with a dose of 30 mg. per week: Lab Results  Component Value Date   INR 2.30 07/09/2016   INR 2.40 06/11/2016   INR 3.30 05/14/2016    ANTI-COAG DOSE: Anticoagulation Dose Instructions as of 07/09/2016      [name]   New Dose 5 mg 5 mg 5 mg 5 mg 2.5 mg 5 mg 5 mg       ANTICOAG SUMMARY: Anticoagulation Episode Summary    Current INR goal:   2.0-3.0  TTR:   91.9 % (6 y)  Next INR check:   08/20/2016  INR from last check:   2.30 (07/09/2016)  Weekly max dose:     Target end date:   Indefinite  [facility] check location:   [facility]  Preferred lab:     Send INR reminders to:   [name]   Indications   Protein C deficiency [facility](HCC) [D68.59]       Comments:         Anticoagulation Care Providers    Provider Role Specialty Phone number   Burns SpainElizabeth A Butcher, MD  Internal Medicine [phone]      ANTICOAG TODAY: Anticoagulation Summary  As of 07/09/2016   INR goal:   2.0-3.0  TTR:     Today's INR:   2.30  Next INR check:   08/20/2016  Target end date:   Indefinite   Indications   Protein C deficiency (HCC) [D68.59]        Anticoagulation Episode Summary    [facility]R check location[facility]   [facility]   Preferred lab:      Send INR reminders to:   [name]   Comments:       Anticoagulation Care Providers    Provider Role Specialty Phone number   Burns SpainElizabeth A Butcher, MD  Internal Medicine [phone]      PATIENT INSTRUCTIONS: Patient Instructions  Patient instructed to take medications as defined in the Anti-coagulation Track section of this encounter.  Patient instructed to take today's dose.  Patient instructed to take 1 tablet of his 5mg  peach colored warfarin once-daily at 6PM all days of week--EXCEPT on Wednesdays, take ONLY 1/2 tablet.   Patient verbalized understanding of these instructions.       FOLLOW-UP Return in 6 weeks (on 08/20/2016) for Follow up INR at 4:30PM.  Hulen LusterJames Jennah Satchell, [name], [name]

## 2016-07-10 NOTE — Patient Instructions (Signed)
Patient instructed to take medications as defined in the Anti-coagulation Track section of this encounter.  Patient instructed to take today's dose.  Patient instructed to take 1 tablet of his 5mg  peach colored warfarin once-daily at 6PM all days of week--EXCEPT on Wednesdays, take ONLY 1/2 tablet.  Patient verbalized understanding of these instructions.

## 2016-07-11 NOTE — Progress Notes (Signed)
INTERNAL MEDICINE TEACHING ATTENDING ADDENDUM - Earl LagosNischal Gia Lusher M.D  Duration- indefinite, Indication- recurrent PE, Protein C deficiency, INR- therapeutic. Agree with pharmacy recommendations as outlined in their note.

## 2016-08-20 ENCOUNTER — Ambulatory Visit (INDEPENDENT_AMBULATORY_CARE_PROVIDER_SITE_OTHER): Payer: Managed Care, Other (non HMO) | Admitting: Pharmacist

## 2016-08-20 DIAGNOSIS — D6859 Other primary thrombophilia: Secondary | ICD-10-CM

## 2016-08-20 DIAGNOSIS — Z7901 Long term (current) use of anticoagulants: Secondary | ICD-10-CM

## 2016-08-22 LAB — POCT INR: INR: 2.4

## 2016-08-22 NOTE — Progress Notes (Signed)
Anti-Coagulation Progress Note  Ronnie Fowler is a 51 y.o. male who is currently on an anti-coagulation regimen.    RECENT RESULTS: Recent results are below, the most recent result is correlated with a dose of 32.5 mg. per week: Lab Results  Component Value Date   INR 2.40 08/22/2016   INR 2.30 07/09/2016   INR 2.40 06/11/2016    ANTI-COAG DOSE: Anticoagulation Dose Instructions as of 08/20/2016      [name] Tue Wed Thu Fri Sat   New Dose 5 mg 5 mg 5 mg 5 mg 2.5 mg 5 mg 5 mg       ANTICOAG SUMMARY: Anticoagulation Episode Summary    Current INR goal:   2.0-3.0  TTR:   92.0 % (6.2 y)  Next INR check:   09/17/2016  INR from last check:   2.30 (07/09/2016)  Most recent INR:    2.40 (08/22/2016)  Weekly max dose:     Target end date:   Indefinite  [facility] check [facility]   [facility]  Preferred lab:     Send INR reminders to:   [name]   Indications   Protein C deficiency [facility]) [D68.59]       Comments:         Anticoagulation Care Providers    Provider Role Specialty Phone number   Burns Spain, MD  Internal Medicine [phone]      ANTICOAG TODAY: Anticoagulation Summary  As of 08/20/2016   INR goal:   2.0-3.0  TTR:     Today's INR:   2.30 (07/09/2016)  Next INR check:   09/17/2016  Target end date:   Indefinite   Indications   Protein C deficiency (HCC) [D68.59]        Anticoagulation Episode Summary    IN[facility] check [facility]   [facility]   Preferred lab:      Send INR reminders to:   [name]   Comments:       Anticoagulation Care Providers    Provider Role Specialty Phone number   Burns Spain, MD  Internal Medicine [phone]      PATIENT INSTRUCTIONS: Patient Instructions  Patient instructed to take medications as defined in the Anti-coagulation Track section of this encounter.  Patient instructed to take today's dose.  Patient instructed to take 1 tablet of your peach-colored  strength warfarin tablets by mouth  once-daily at 6PM each day--EXCEPT ON [facility], take ONLY 1/2 tablet each Wednesday. Patient verbalized understanding of these instructions.       FOLLOW-UP Return in 4 weeks (on 09/17/2016) for Follow up INR at 4:30PM.  [name], [name], [name]

## 2016-08-22 NOTE — Patient Instructions (Signed)
Patient instructed to take medications as defined in the Anti-coagulation Track section of this encounter.  Patient instructed to take today's dose.  Patient instructed to take 1 tablet of your peach-colored  strength warfarin tablets by mouth once-daily at 6PM each day--EXCEPT ON [facility], take ONLY 1/2 tablet each Wednesday. Patient verbalized understanding of these instructions.

## 2016-09-24 ENCOUNTER — Ambulatory Visit (INDEPENDENT_AMBULATORY_CARE_PROVIDER_SITE_OTHER): Payer: Managed Care, Other (non HMO) | Admitting: Pharmacist

## 2016-09-24 DIAGNOSIS — Z86711 Personal history of pulmonary embolism: Secondary | ICD-10-CM

## 2016-09-24 DIAGNOSIS — D6859 Other primary thrombophilia: Secondary | ICD-10-CM

## 2016-09-24 DIAGNOSIS — Z7901 Long term (current) use of anticoagulants: Secondary | ICD-10-CM | POA: Diagnosis not present

## 2016-09-24 LAB — POCT INR: INR: 3

## 2016-09-24 MED ORDER — WARFARIN SODIUM 5 MG PO TABS: ORAL_TABLET | ORAL | 1 refills | Status: DC

## 2016-09-24 NOTE — Patient Instructions (Signed)
Patient instructed to take medications as defined in the Anti-coagulation Track section of this encounter.  Patient instructed to take today's dose.  Patient instructed to take 1/2 tablet of your 5mg  peach-colored warfarin tablets on Mondays and Thursdays. On ALL OTHER DAYS--take ONE (1) tablet by mouth at [facility]6PM each day.  Patient verbalized understanding of these instructions.

## 2016-09-24 NOTE — Progress Notes (Signed)
Anti-Coagulation Progress Note  Ronnie Fowler is a 51 y.o. male who is currently on an anti-coagulation regimen whose PCP is Dr. Salomon MastButhcher. Patient is on indefinite warfarin for Protein C Deficiency (HCC) [68.59].  Annual re-assessment for continuing analysis of risks versus benefit should be performed annually around his documented start date of January 2012.    RECENT RESULTS: Recent results are below, the most recent result is correlated with a dose of 32.5 mg. per week: Lab Results  Component Value Date   INR 3.0 09/24/2016   INR 2.40 08/22/2016   INR 2.30 07/09/2016    ANTI-COAG DOSE: Anticoagulation Warfarin Dose Instructions as of 09/24/2016      [name]   New Dose 5 mg 2.5 mg 5 mg 5 mg 2.5 mg 5 mg 5 mg       ANTICOAG SUMMARY: Anticoagulation Episode Summary    Current INR goal:   2.0-3.0  TTR:   92.1 % (6.3 y)  Next INR check:   10/22/2016  INR from last check:   3.0 (09/24/2016)  Weekly max warfarin dose:     Target end date:   Indefinite  [facility]   [facility]  Preferred lab:     Send INR reminders to:   [name]   Indications   Protein C deficiency [facility](HCC) [D68.59]       Comments:         Anticoagulation Care Providers    Provider Role Specialty Phone number   Burns SpainButcher, Elizabeth A, MD  Internal Medicine [phone]      ANTICOAG TODAY: Anticoagulation Summary  As of 09/24/2016   INR goal:   2.0-3.0  TTR:     Today's INR:   3.0  Next INR check:   10/22/2016  Target end date:   Indefinite   Indications   Protein C deficiency (HCC) [D68.59]        Anticoagulation Episode Summary    INR check location[facility]   [facility]   Preferred lab:      Send INR reminders to:   [name]   Comments:       Anticoagulation Care Providers    Provider Role Specialty Phone number   Burns SpainButcher, Elizabeth A, MD  Internal Medicine [phone]      PATIENT INSTRUCTIONS: Patient Instructions  Patient instructed to take  medications as defined in the Anti-coagulation Track section of this encounter.  Patient instructed to take today's dose.  Patient instructed to take 1/2 tablet of your 5mg  peach-colored warfarin tablets on Mondays and Thursdays. On ALL OTHER DAYS--take ONE (1) tablet by mouth at [facility]6PM each day.  Patient verbalized understanding of these instructions.       FOLLOW-UP Return in 4 weeks (on 10/22/2016) for Follow up INR at 4:30PM.  Hulen LusterJames Boluwatife Mutchler, [name], [name]

## 2016-09-25 NOTE — Progress Notes (Signed)
I reviewed Dr. Saralyn PilarGroce's note.  [facility] for hypercoagulable state.  INR was 3.0 and coumadin dose was decreased.

## 2016-10-03 ENCOUNTER — Encounter: Payer: Self-pay | Admitting: *Deleted

## 2016-10-22 ENCOUNTER — Ambulatory Visit: Payer: Managed Care, Other (non HMO)

## 2016-11-05 ENCOUNTER — Ambulatory Visit (INDEPENDENT_AMBULATORY_CARE_PROVIDER_SITE_OTHER): Payer: Managed Care, Other (non HMO) | Admitting: Pharmacist

## 2016-11-05 DIAGNOSIS — Z7901 Long term (current) use of anticoagulants: Secondary | ICD-10-CM

## 2016-11-05 DIAGNOSIS — D6859 Other primary thrombophilia: Secondary | ICD-10-CM

## 2016-11-05 LAB — POCT INR: INR: 2.7

## 2016-11-05 NOTE — Progress Notes (Signed)
Anticoagulation Management Ronnie ShepherdSamuel Fowler is a 51 y.o. male who reports to the clinic for monitoring of warfarin treatment.    Indication: protein C deficiency  Duration: indefinite Supervising physician: Debe CoderEmily Mullen  Anticoagulation Clinic Visit History: Patient does not report signs/symptoms of bleeding or thromboembolism  Other recent changes: No diet, medications, lifestyle endorsed by the patient.  Anticoagulation Episode Summary    Current INR goal:   2.0-3.0  TTR:   92.3 % (6.4 y)  Next INR check:   12/03/2016  INR from last check:   2.70 (11/05/2016)  Weekly max warfarin dose:     Target end date:   Indefinite  [facility]   [facility]  Preferred lab:     Send INR reminders to:   [name]   Indications   Protein C deficiency [facility](HCC) [D68.59]       Comments:         Anticoagulation Care Providers    Provider Role Specialty Phone number   Burns SpainButcher, Elizabeth A, MD  Internal Medicine [phone]     ASSESSMENT Recent Results: The most recent result is correlated with 30 mg per week: Lab Results  Component Value Date   INR 2.70 11/05/2016   INR 3.0 09/24/2016   INR 2.40 08/22/2016    Anticoagulation Dosing: INR as of 11/05/2016 and Previous Warfarin Dosing Information    INR Dt [facility] [name]   11/05/2016 2.70 2.0-3.0 30 mg 5 mg 2.5 mg 5 mg 5 mg 2.5 mg 5 mg 5 mg    Anticoagulation Warfarin Dose Instructions as of 11/05/2016      Total [name]   New Dose 30 mg 5 mg 2.5 mg 5 mg 5 mg 2.5 mg 5 mg 5 mg     (5 mg x 1)  (5 mg x 0.5)  (5 mg x 1)  (5 mg x 1)  (5 mg x 0.5)  (5 mg x 1)  (5 mg x 1)                         Description   Take one (1) tablet of your 5mg  peach-colored warfarin tablets all days of week--EXCEPT on Mondays and Thursdays--take ONLY 1/2 tablet on these days.      INR today: Therapeutic  PLAN Weekly dose was unchanged.  Patient Instructions  Patient instructed to take  medications as defined in the Anti-coagulation Track section of this encounter.  Patient instructed to take today's dose.  Patient instructed to take one (1) tablet of your 5mg  peach-colored warfarin tablets all days of week--EXCEPT on Mondays and Thursdays--take ONLY 1/2 tablet on these days.  Patient verbalized understanding of these instructions.     Patient advised to contact clinic or seek medical attention if signs/symptoms of bleeding or thromboembolism occur.  Patient verbalized understanding by repeating back information and was advised to contact me if further medication-related questions arise. Patient was also provided an information handout.  Follow-up Return in 4 weeks (on 12/03/2016) for Follow up INR at 4:30PM.  Janie MorningGroce [name], Ricke HeyJames Boyd PharmD, [name], CPP  15 minutes spent face-to-face with the patient during the encounter. 50% of time spent on education. 50% of time was spent on finger-stick point of care INR sample collection, processing, interpretation and data-entry into EPIC/CHL and [email or web address] .

## 2016-11-05 NOTE — Patient Instructions (Signed)
Patient instructed to take medications as defined in the Anti-coagulation Track section of this encounter.  Patient instructed to take today's dose.  Patient instructed to take one (1) tablet of your 5mg  peach-colored warfarin tablets all days of week--EXCEPT on Mondays and Thursdays--take ONLY 1/2 tablet on these days.  Patient verbalized understanding of these instructions.

## 2016-11-06 NOTE — Progress Notes (Signed)
I reviewed Dr. Saralyn PilarGroce's note.  [facility] and INR was at goal.

## 2016-12-03 ENCOUNTER — Ambulatory Visit: Payer: Managed Care, Other (non HMO)

## 2016-12-24 ENCOUNTER — Ambulatory Visit (INDEPENDENT_AMBULATORY_CARE_PROVIDER_SITE_OTHER): Payer: Managed Care, Other (non HMO) | Admitting: Pharmacist

## 2016-12-24 DIAGNOSIS — D6859 Other primary thrombophilia: Secondary | ICD-10-CM

## 2016-12-24 DIAGNOSIS — Z7901 Long term (current) use of anticoagulants: Secondary | ICD-10-CM

## 2016-12-24 LAB — POCT INR: INR: 3.1

## 2016-12-24 NOTE — Patient Instructions (Signed)
Patient instructed to take medications as defined in the Anti-coagulation Track section of this encounter.  Patient instructed to take today's dose.  Patient instructed to take one (1) tablet of your 5mg  peach-colored warfarin tablets all days of week--EXCEPT on Mondays, Wednesdays and Fridays--take ONLY 1/2 tablet on these days Patient verbalized understanding of these instructions.

## 2016-12-24 NOTE — Progress Notes (Signed)
Anticoagulation Management Ronnie Fowler is a 51 y.o. male who reports to the clinic for monitoring of warfarin treatment.    Indication: protein C deficiency Duration: indefinite Supervising physician: Carlynn Purl  Anticoagulation Clinic Visit History: Patient does not report signs/symptoms of bleeding or thromboembolism  Other recent changes: No diet, medications, lifestyle changes endorsed by the patient to me.  Anticoagulation Episode Summary    Current INR goal:   2.0-3.0  TTR:   91.9 % (6.5 y)  Next INR check:   01/21/2017  INR from last check:   3.10! (12/24/2016)  Weekly max warfarin dose:     Target end date:   Indefinite  [facility]R check [facility]   [facility]  Preferred lab:     Send INR reminders to:   [name]   Indications   Protein C deficiency [facility]) [D68.59]       Comments:         Anticoagulation Care Providers    Provider Role Specialty Phone number   Burns Spain, MD  Internal Medicine [phone]      No Known Allergies Prior to Admission medications   Medication Sig Start Date End Date Taking? Authorizing Provider  amLODipine (NORVASC) 10 MG tablet TAKE ONE TABLET BY MOUTH ONCE DAILY 06/18/16  Yes Beather Arbour, MD  fluticasone (FLONASE) 50 MCG/ACT nasal spray 1 spray by Nasal route Daily. 07/20/10  Yes [provider]  meloxicam (MOBIC) 7.5 MG tablet Take 1 tablet (7.5 mg total) by mouth daily as needed for pain. 12/30/15  Yes Beather Arbour, MD  warfarin (COUMADIN) 5 MG tablet Take 1 tablet by mouth daily at [facility] on all days of week--EXCEPT on Mondays and Thursdays, take ONLY 1/2 tablet on these days. 09/24/16  Yes Inez Catalina, MD   Past Medical History:  Diagnosis Date  . HTN (hypertension)   . Lupus anticoagulant positive   . PE (pulmonary embolism) 03/2002; 05/2005  . Protein C deficiency [facility])    Social History   Social History  . Marital status: Married    Spouse name: N/A  . Number of children: N/A  . Years of  education: N/A   Social History Main Topics  . Smoking status: Never Smoker  . Smokeless tobacco: Never Used  . Alcohol use No  . Drug use: No  . Sexual activity: Not Currently    Partners: Female   Other Topics Concern  . Not on file   Social History Narrative  . No narrative on file   No family history on file.  ASSESSMENT Recent Results: The most recent result is correlated with 30 mg per week: Lab Results  Component Value Date   INR 3.10 12/24/2016   INR 2.70 11/05/2016   INR 3.0 09/24/2016    Anticoagulation Dosing: INR as of 12/24/2016 and Previous Warfarin Dosing Information    INR Dt INR Goal Jacki Cones [name]   12/24/2016 3.10 2.0-3.0 30 mg 5 mg 2.5 mg 5 mg 5 mg 2.5 mg 5 mg 5 mg    Previous description   Take one (1) tablet of your 5mg  peach-colored warfarin tablets all days of week--EXCEPT on Mondays and Thursdays--take ONLY 1/2 tablet on these days.    Anticoagulation Warfarin Dose Instructions as of 12/24/2016      Total [name]   New Dose 27.5 mg 5 mg 2.5 mg 5 mg 2.5 mg 5 mg 2.5 mg 5 mg     (  5 mg x 1)  (5 mg x 0.5)  (5 mg x 1)  (5 mg x 0.5)  (5 mg x 1)  (5 mg x 0.5)  (5 mg x 1)                         Description   Take one (1) tablet of your 5mg  peach-colored warfarin tablets all days of week--EXCEPT on Mondays, Wednesdays and Fridays--take ONLY 1/2 tablet on these days.      INR today: Therapeutic  PLAN Weekly dose was decreased by 8% to 27.5 mg per week  Patient Instructions  Patient instructed to take medications as defined in the Anti-coagulation Track section of this encounter.  Patient instructed to take today's dose.  Patient instructed to take one (1) tablet of your 5mg  peach-colored warfarin tablets all days of week--EXCEPT on Mondays, Wednesdays and Fridays--take ONLY 1/2 tablet on these days Patient verbalized understanding of these instructions.     Patient advised to contact clinic or seek  medical attention if signs/symptoms of bleeding or thromboembolism occur.  Patient verbalized understanding by repeating back information and was advised to contact me if further medication-related questions arise. Patient was also provided an information handout.  Follow-up Return in 4 weeks (on 01/21/2017) for Follow up INR at 4:30PM.  Janie Morning, Ricke Hey PharmD, [name], CPP  15 minutes spent face-to-face with the patient during the encounter. 50% of time spent on education. 50% of time was spent on fingerstick point of care INR sample collection, processing, results interpretation, dose adjustment and documentation in EPIC/CHL and [email or web address].

## 2016-12-31 NOTE — Progress Notes (Signed)
INTERNAL MEDICINE TEACHING ATTENDING ADDENDUM - Gust Rung, DO Duration- indefinate, Indication- protein c def, INR-  Lab Results  Component Value Date   INR 3.10 12/24/2016  . Agree with pharmacy recommendations as outlined in their note.

## 2017-01-21 ENCOUNTER — Ambulatory Visit: Payer: Managed Care, Other (non HMO)

## 2017-02-04 ENCOUNTER — Ambulatory Visit (INDEPENDENT_AMBULATORY_CARE_PROVIDER_SITE_OTHER): Payer: Managed Care, Other (non HMO) | Admitting: Pharmacist

## 2017-02-04 DIAGNOSIS — Z7901 Long term (current) use of anticoagulants: Secondary | ICD-10-CM | POA: Diagnosis not present

## 2017-02-04 DIAGNOSIS — D6859 Other primary thrombophilia: Secondary | ICD-10-CM | POA: Diagnosis not present

## 2017-02-04 LAB — POCT INR: INR: 2.7

## 2017-02-04 NOTE — Progress Notes (Signed)
INTERNAL MEDICINE TEACHING ATTENDING ADDENDUM - Gust Rung, DO Duration- indefinate, Indication- protein c def, INR-  Lab Results  Component Value Date   INR 2.70 02/04/2017  . Agree with pharmacy recommendations as outlined in their note.

## 2017-02-04 NOTE — Progress Notes (Signed)
Anticoagulation Management Ronnie Fowler is a 51 y.o. male who reports to the clinic for monitoring of warfarin treatment.    Indication: protein C deficiency  Duration: indefinite Supervising physician: Carlynn Purl  Anticoagulation Clinic Visit History: Patient does not report signs/symptoms of bleeding or thromboembolism  Other recent changes: No diet, medications, lifestyle changes endorsed by the patient to me.  Anticoagulation Episode Summary    Current INR goal:   2.0-3.0  TTR:   91.6 % (6.6 y)  Next INR check:   04/12/2017  INR from last check:   2.70 (02/04/2017)  Weekly max warfarin dose:     Target end date:   Indefinite  [facility]   [facility]  Preferred lab:     Send INR reminders to:   [name]   Indications   Protein C deficiency [facility]) [D68.59]       Comments:         Anticoagulation Care Providers    Provider Role Specialty Phone number   Burns Spain, MD  Internal Medicine [phone]      No Known Allergies Prior to Admission medications   Medication Sig Start Date End Date Taking? Authorizing Provider  amLODipine (NORVASC) 10 MG tablet TAKE ONE TABLET BY MOUTH ONCE DAILY 06/18/16  Yes Beather Arbour, MD  fluticasone (FLONASE) 50 MCG/ACT nasal spray 1 spray by Nasal route Daily. 07/20/10  Yes [provider]  meloxicam (MOBIC) 7.5 MG tablet Take 1 tablet (7.5 mg total) by mouth daily as needed for pain. 12/30/15  Yes Beather Arbour, MD  warfarin (COUMADIN) 5 MG tablet Take 1 tablet by mouth daily at [facility] on all days of week--EXCEPT on Mondays and Thursdays, take ONLY 1/2 tablet on these days. 09/24/16  Yes Inez Catalina, MD   Past Medical History:  Diagnosis Date  . HTN (hypertension)   . Lupus anticoagulant positive   . PE (pulmonary embolism) 03/2002; 05/2005  . Protein C deficiency [facility])    Social History   Social History  . Marital status: Married    Spouse name: N/A  . Number of children: N/A  . Years of  education: N/A   Social History Main Topics  . Smoking status: Never Smoker  . Smokeless tobacco: Never Used  . Alcohol use No  . Drug use: No  . Sexual activity: Not Currently    Partners: Female   Other Topics Concern  . Not on file   Social History Narrative  . No narrative on file   No family history on file.  ASSESSMENT Recent Results: The most recent result is correlated with 27.5 mg per week: Lab Results  Component Value Date   INR 2.70 02/04/2017   INR 3.10 12/24/2016   INR 2.70 11/05/2016    Anticoagulation Dosing: INR as of 02/04/2017 and Previous Warfarin Dosing Information    INR Dt INR Goal [facility] [name]   02/04/2017 2.70 2.0-3.0 27.5 mg 5 mg 2.5 mg 5 mg 2.5 mg 5 mg 2.5 mg 5 mg    Previous description   Take one (1) tablet of your  peach-colored warfarin tablets all days of week--EXCEPT on Mondays, Wednesdays and Fridays--take ONLY 1/2 tablet on these days.    Anticoagulation Warfarin Dose Instructions as of 02/04/2017      Total [name]   New Dose 27.5 mg 5 mg 2.5 mg 5 mg 2.5 mg 5 mg 2.5 mg  5 mg     (5 mg x 1)  (5 mg x 0.5)  (5 mg x 1)  (5 mg x 0.5)  (5 mg x 1)  (5 mg x 0.5)  (5 mg x 1)                         Description   Take one (1) tablet of your  peach-colored warfarin tablets all days of week--EXCEPT on Mondays, Wednesdays and Fridays--take ONLY 1/2 tablet on these days.      INR today: Therapeutic  PLAN Weekly dose was unchanged   Patient Instructions  Patient instructed to take medications as defined in the Anti-coagulation Track section of this encounter.  Patient instructed to take today's dose.  Patient instructed to take  one (1) tablet of your  peach-colored warfarin tablets all days of week--EXCEPT on Mondays, Wednesdays and Fridays--take ONLY 1/2 tablet on these days.  Patient verbalized understanding of these instructions.     Patient advised to contact clinic or seek  medical attention if signs/symptoms of bleeding or thromboembolism occur.  Patient verbalized understanding by repeating back information and was advised to contact me if further medication-related questions arise. Patient was also provided an information handout.  Follow-up Return in 2 months (on 04/12/2017) for Follow up INR during office visit with PCP.  Elicia Lamp, PharmD, [name], CPP  15 minutes spent face-to-face with the patient during the encounter. 50% of time spent on education. 50% of time was spent on fingerstick point of care INR sample collection, processing, results determination, and documentation in [email or web address].

## 2017-02-04 NOTE — Patient Instructions (Signed)
Patient instructed to take medications as defined in the Anti-coagulation Track section of this encounter.  Patient instructed to take today's dose.  Patient instructed to take one (1) tablet of your 5mg peach-colored warfarin tablets all days of week--EXCEPT on Mondays, Wednesdays and Fridays--take ONLY 1/2 tablet on these days Patient verbalized understanding of these instructions.    

## 2017-04-12 ENCOUNTER — Encounter: Payer: Managed Care, Other (non HMO) | Admitting: Internal Medicine

## 2017-04-12 ENCOUNTER — Ambulatory Visit (INDEPENDENT_AMBULATORY_CARE_PROVIDER_SITE_OTHER): Payer: Managed Care, Other (non HMO) | Admitting: Pharmacist

## 2017-04-12 DIAGNOSIS — Z7901 Long term (current) use of anticoagulants: Secondary | ICD-10-CM

## 2017-04-12 DIAGNOSIS — D6859 Other primary thrombophilia: Secondary | ICD-10-CM

## 2017-04-12 DIAGNOSIS — Z86711 Personal history of pulmonary embolism: Secondary | ICD-10-CM | POA: Diagnosis not present

## 2017-04-12 LAB — POCT INR: INR: 1.8

## 2017-04-12 MED ORDER — WARFARIN SODIUM 5 MG PO TABS: ORAL_TABLET | ORAL | 0 refills | Status: DC

## 2017-04-12 NOTE — Progress Notes (Signed)
Anticoagulation Management Ronnie Fowler is a 51 y.o. male who reports to the clinic for monitoring of warfarin treatment.    Indication: protein C deficiency  Duration: indefinite Supervising physician: Blanch MediaElizabeth Butcher  Anticoagulation Clinic Visit History: Patient does not report signs/symptoms of bleeding or thromboembolism  Other recent changes: No diet, medications, lifestyle changes endorsed by the patient to me.   Anticoagulation Episode Summary    Current INR goal:   2.0-3.0  TTR:   91.3 % (6.8 y)  Next INR check:   04/26/2017  INR from last check:   1.8! (04/12/2017)  Weekly max warfarin dose:     Target end date:   Indefinite  [facility]   [facility]  Preferred lab:     Send INR reminders to:   [name]   Indications   Protein C deficiency (HCC) [D68.59]       Comments:         Anticoagulation Care Providers    Provider Role Specialty Phone number   Burns SpainButcher, Elizabeth A, MD  Internal Medicine [phone]     No Known Allergies Medication Sig  amLODipine (NORVASC) 10 MG tablet TAKE ONE TABLET BY MOUTH ONCE DAILY  fluticasone (FLONASE) 50 MCG/ACT nasal spray 1 spray by Nasal route Daily.  meloxicam (MOBIC) 7.5 MG tablet Take 1 tablet (7.5 mg total) by mouth daily as needed for pain.  warfarin (COUMADIN) 5 MG tablet Take 1 tablet by mouth daily at [facility]6PM on all days of week--EXCEPT on Mondays and Wednesdays, take ONLY 1/2 tablet on these days.   Past Medical History:  Diagnosis Date  . HTN (hypertension)   . Lupus anticoagulant positive   . PE (pulmonary embolism) 03/2002; 05/2005  . Protein C deficiency (HCC)    Social History   Socioeconomic History  . Marital status: Married    Spouse name: Not on file  . Number of children: Not on file  . Years of education: Not on file  . Highest education level: Not on file  Social Needs  . Financial resource strain: Not on file  . Food insecurity - worry: Not on file  . Food insecurity -  inability: Not on file  . Transportation needs - medical: Not on file  . Transportation needs - non-medical: Not on file  Occupational History  . Not on file  Tobacco Use  . Smoking status: Never Smoker  . Smokeless tobacco: Never Used  Substance and Sexual Activity  . Alcohol use: No    Alcohol/week: 0.0 oz  . Drug use: No  . Sexual activity: Not Currently    Partners: Female  Other Topics Concern  . Not on file  Social History Narrative  . Not on file   No family history on file.  ASSESSMENT Recent Results: Lab Results  Component Value Date   INR 1.8 04/12/2017   INR 2.70 02/04/2017   INR 3.10 12/24/2016   Anticoagulation Dosing:   INR today: Subtherapeutic  PLAN Weekly dose was increased from 27.5 mg to 30 mg  Patient Instructions  Patient educated about medication as defined in this encounter and verbalized understanding by repeating back instructions provided.    Patient advised to contact clinic or seek medical attention if signs/symptoms of bleeding or thromboembolism occur.  Patient verbalized understanding by repeating back information and was advised to contact me if further medication-related questions arise. Patient was also provided an information handout.  Follow-up Return in about 2 weeks (around 04/26/2017).  Ronnie Fowler

## 2017-04-12 NOTE — Patient Instructions (Signed)
Patient educated about medication as defined in this encounter and verbalized understanding by repeating back instructions provided.   

## 2017-04-19 ENCOUNTER — Ambulatory Visit: Payer: Managed Care, Other (non HMO) | Admitting: Internal Medicine

## 2017-04-19 ENCOUNTER — Encounter: Payer: Self-pay | Admitting: Internal Medicine

## 2017-04-19 VITALS — BP 158/94 | HR 66 | Temp 97.6°F | Ht 68.0 in | Wt 186.3 lb

## 2017-04-19 DIAGNOSIS — Z86711 Personal history of pulmonary embolism: Secondary | ICD-10-CM | POA: Diagnosis not present

## 2017-04-19 DIAGNOSIS — H6123 Impacted cerumen, bilateral: Secondary | ICD-10-CM | POA: Diagnosis not present

## 2017-04-19 DIAGNOSIS — R339 Retention of urine, unspecified: Secondary | ICD-10-CM | POA: Diagnosis not present

## 2017-04-19 DIAGNOSIS — R102 Pelvic and perineal pain: Secondary | ICD-10-CM

## 2017-04-19 DIAGNOSIS — D6859 Other primary thrombophilia: Secondary | ICD-10-CM

## 2017-04-19 DIAGNOSIS — Z7901 Long term (current) use of anticoagulants: Secondary | ICD-10-CM | POA: Diagnosis not present

## 2017-04-19 LAB — POCT URINALYSIS DIPSTICK
Bilirubin, UA: NEGATIVE
Blood, UA: NEGATIVE
Glucose, UA: NEGATIVE
Ketones, UA: NEGATIVE
Leukocytes, UA: NEGATIVE
Nitrite, UA: NEGATIVE
Protein, UA: NEGATIVE
Spec Grav, UA: 1.02 (ref 1.010–1.025)
Urobilinogen, UA: 0.2 E.U./dL
pH, UA: 7 (ref 5.0–8.0)

## 2017-04-19 LAB — POCT INR: INR: 2.4

## 2017-04-19 NOTE — Assessment & Plan Note (Addendum)
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 Goal INR 2-3. He is adherent to his Coumadin regimen. He reports no bleeding. He follows with Dr. Alexandria LodgeGroce, but missed his most recent appointment. INR checked today and is 2.4, which is at goal.

## 2017-04-19 NOTE — Progress Notes (Signed)
   CC: Abdominal Pain, Protein C deficiency, Decreased Hearing  HPI:  [name] is a 51 y.o. M with PMHx listed below presenting for Abdominal Pain, Protein C deficiency, Decreased Hearing. Please see the A&P for the status of the patient's chronic medical problems.  Patient  Complains of 3 weeks of suprapubic pain. The pain is described as a 7/10 pain, that comes and goes. He states the pain typically lasts for about 1-2 hours and occurs every few days. He states the pain is relieved by urination. He states he has seen white/cloudiness in his urine after urination. He denies increased frequency, odor, discharge, hematuria, difficulty initiating or maintaining urination, fever, chill, or constipation.  Past Medical History:  Diagnosis Date  . HTN (hypertension)   . Lupus anticoagulant positive   . PE (pulmonary embolism) 03/2002; 05/2005  . Protein C deficiency (HCC)    Review of Systems: Performed and negative except as otherwise indicated.  Physical Exam:  Vitals:   04/19/17 1544  BP: (!) 155/82  Pulse: 64  Temp: 97.6 F (36.4 C)  TempSrc: Oral  SpO2: 100%  Weight: 186 lb 4.8 oz (84.5 kg)  Height: 5\' 8"  (1.727 m)   Physical Exam  Constitutional: He appears well-developed and well-nourished.  Cardiovascular: Normal rate, regular rhythm and normal heart sounds.  Pulmonary/Chest: Effort normal and breath sounds normal. No respiratory distress.  Abdominal: Soft. Bowel sounds are normal. He exhibits no distension.  Suprapubic tenderness on palpation  Musculoskeletal:  No CVA tenderness    Assessment & Plan:   See Encounters Tab for problem based charting.  Patient seen with Dr. Sandre Kittyaines

## 2017-04-19 NOTE — Assessment & Plan Note (Signed)
Patient complains of some decreased hearing for the last few days. Cerumen impaction seen on exam bilaterally. Cerumen removed via current and flushing with moderate results and improvement of symptoms. Patient states he will try over the counter disimpaction products if symptoms recur and return to clinic if unsuccessful.

## 2017-04-19 NOTE — Patient Instructions (Addendum)
Thank you for allowing us to care for you.  For your abdominal pain: - Your initial lab study was negative for infection. We are sending your urine for further testing - We have provided you with a referral to Urology - Let us know if you do not hear from urology in the next 3 weeks  For your decreased hear: - This was cause by ear wax packed into your ear canal - We cleaned this out for you some today - If you continue to experience problems, you can try over the counter products  We check your INR today and it was 2.4, which is in the required range of 2-3.

## 2017-04-20 LAB — URINALYSIS, ROUTINE W REFLEX MICROSCOPIC
Bilirubin, UA: NEGATIVE
Glucose, UA: NEGATIVE
Ketones, UA: NEGATIVE
Leukocytes, UA: NEGATIVE
Nitrite, UA: NEGATIVE
Protein, UA: NEGATIVE
RBC, UA: NEGATIVE
Specific Gravity, UA: 1.01 (ref 1.005–1.030)
Urobilinogen, Ur: 0.2 mg/dL (ref 0.2–1.0)
pH, UA: 7 (ref 5.0–7.5)

## 2017-04-22 ENCOUNTER — Encounter: Payer: Self-pay | Admitting: Internal Medicine

## 2017-04-22 NOTE — Progress Notes (Signed)
Letter dent to patient informing him of his normal laboratroy results.  Ginette OttoAlec Melvin, DO IM PGY-1

## 2017-04-23 NOTE — Progress Notes (Signed)
Internal Medicine Clinic Attending  I saw and evaluated the patient.  I personally confirmed the key portions of the history and exam documented by Dr. Alinda MoneyMelvin and I reviewed pertinent patient test results.  The assessment, diagnosis, and plan were formulated together and I agree with the documentation in the resident's note.  51 year old man here with suprapubic pain and pressure. He reports the pain is worst in the morning when he first gets up, and is relieved somewhat after he urinates. He denies any significant trouble with getting his urinary stream started or maintaining a stream. He denies any dysuria and reports no episodes of incontinence of bowels or bladder. Urine dip in clinic is normal, but we will send formal urinalysis to evaluate further for pyuria. However, infection is unlikely with a completely normal dipstick. More likely, this represents symptoms of BPH. Bladder scan showed mild urinary retention, and we will refer to urology for further evaluation and management.  Anne ShutterAlexander N Raines, MD

## 2017-06-07 ENCOUNTER — Encounter: Payer: Managed Care, Other (non HMO) | Admitting: Internal Medicine

## 2017-06-21 ENCOUNTER — Ambulatory Visit: Payer: Managed Care, Other (non HMO) | Attending: Urology | Admitting: Physical Therapy

## 2017-06-21 ENCOUNTER — Encounter: Payer: Self-pay | Admitting: Physical Therapy

## 2017-06-21 ENCOUNTER — Other Ambulatory Visit: Payer: Self-pay

## 2017-06-21 DIAGNOSIS — M6281 Muscle weakness (generalized): Secondary | ICD-10-CM

## 2017-06-21 DIAGNOSIS — R252 Cramp and spasm: Secondary | ICD-10-CM | POA: Diagnosis present

## 2017-06-21 NOTE — Therapy (Signed)
[facility] [address] [place], [place], [address] Phone: [phone]   Fax:  [phone]  Physical Therapy Evaluation  Patient Details  Name: Ronnie Fowler MRN: [id number] Date of Birth: January 27, 1966 Referring Provider: Dr. Hadley Pen   Encounter Date: 06/21/2017  PT End of Session - 06/21/17 0842    Visit Number  1    Date for PT Re-Evaluation  08/16/17    Authorization Type  [employer]    PT Start Time  0800    PT Stop Time  0838    PT Time Calculation (min)  38 min    Activity Tolerance  Treatment limited secondary to medical complications (Comment)    Behavior During Therapy  [facility] for tasks assessed/performed       Past Medical History:  Diagnosis Date  . HTN (hypertension)   . Lupus anticoagulant positive   . PE (pulmonary embolism) 03/2002; 05/2005  . Protein C deficiency (HCC)     History reviewed. No pertinent surgical history.  There were no vitals filed for this visit.   Subjective Assessment - 06/21/17 0804    Subjective  Patient reports last November could feel a pain suprapubic.  Pain is better after her urinates.  Patient reports pain with sneezing. No strainning to have a bowel movement.     Patient Stated Goals  Reduce pain    Currently in Pain?  Yes    Pain Score  7     Pain Location  Abdomen    Pain Orientation  Lower    Pain Descriptors / Indicators  Sore    Pain Type  Acute pain    Pain Onset  More than a month ago    Pain Frequency  Intermittent    Aggravating Factors   when has to urinated, sneezing, lay supine    Pain Relieving Factors  after urination    Multiple Pain Sites  No         [facility] PT Assessment - 06/21/17 0001      Assessment   Medical Diagnosis  Suprapubic pain/pelvic pain    Referring Provider  Dr. Hadley Pen    Onset Date/Surgical Date  03/07/17    Prior Therapy  None      Precautions   Precautions  Other (comment)    Precaution Comments  on coumadin       Restrictions   Weight Bearing Restrictions  No      Balance Screen   Has the patient fallen in the past 6 months  No    Has the patient had a decrease in activity level because of a fear of falling?   No    Is the patient reluctant to leave their home because of a fear of falling?   No      Home Public house manager residence      Prior Function   Level of Independence  Independent    Vocation  Full time employment    Vocation Requirements  standing, lifting    Leisure  exercise daily, treadmill, bicycle, machines      Cognition   Overall Cognitive Status  Within Functional Limits for tasks assessed      Observation/Other Assessments   Focus on Therapeutic Outcomes (FOTO)   Patient limited by 20% due to pain      Posture/Postural Control   Posture/Postural Control  No significant limitations      ROM / Strength   AROM /  PROM / Strength  AROM;PROM;Strength      AROM   Overall AROM Comments  lumbar ROM is full      PROM   Right Hip External Rotation   25    Left Hip External Rotation   25      Strength   Overall Strength Comments  abdominal strength is 2/5      Palpation   SI assessment   left ilium rotated posteriorly    Palpation comment  tender suprapubically, lower abdomen with palpation and abdominal contraction             Objective measurements completed on examination: See above findings.    Pelvic Floor Special Questions - 06/21/17 0001    Urinary Leakage  No    Urinary urgency  Yes    Urinary frequency  -- when urinates it is slow       [facility] Adult PT Treatment/Exercise - 06/21/17 0001      Manual Therapy   Manual Therapy  Muscle Energy Technique;Soft tissue mobilization;Taping    Soft tissue mobilization  lower abdomen and along the suprapubic bone    Muscle Energy Technique  to correct plevis afterwards was corrected    Kinesiotex  Inhibit Muscle      Kinesiotix   Inhibit Muscle   rectus abdominus              PT Education - 06/21/17 0841    Education provided  Yes    Education Details  massage the lower abdomen daily, kinesiotape can stay on for 3 days, use heat for 15 min on lower abdomen    Person(s) Educated  Patient    Methods  Explanation    Comprehension  Verbalized understanding;Returned demonstration       PT Short Term Goals - 06/21/17 0843      PT SHORT TERM GOAL #1   Title  independent with massage of the lower abdomen    Time  4    Period  Weeks    Status  New    Target Date  07/19/17      PT SHORT TERM GOAL #2   Title  understand how to brace abdominals with minimal pain    Time  4    Period  Weeks    Status  New    Target Date  07/19/17        PT Long Term Goals - 06/21/17 0832      PT LONG TERM GOAL #1   Title  independent with HEP    Time  8    Period  Weeks    Status  New    Target Date  08/16/17      PT LONG TERM GOAL #2   Title  when sneeze no to minimal pain in the suprapubic area due improved muscle mobility    Time  8    Period  Weeks    Status  New    Target Date  08/16/17      PT LONG TERM GOAL #3   Title  laying down with legs straight pain decreased to minimal to none due to decreased trigger points in the abdominals    Time  8    Period  Weeks    Status  New    Target Date  08/16/17      PT LONG TERM GOAL #4   Title  ability to exercise his abdominals correctly with no pain due to reduction in trigger  points    Time  8    Period  Weeks    Status  New    Target Date  08/16/17      PT LONG TERM GOAL #5   Title  improved urine stream to full force prior to pain due to ability to contract the abdominals correctly    Time  8    Period  Weeks    Status  New    Target Date  08/16/17             Plan - 06/21/17 0836    Clinical Impression Statement  Patient is a 52 year old male with suprapubic pain that started suddenly in 03/2017.  Patient reports his pain level is 7/10 with sneezing, prior to urinating, when  laying flat on his back, and with abdominal contraction.  Bilateral hip ER is 25 degrees.  Patient left ilium is rotated posteriorly but is corrected with muscle energy.  Patient has palpable tenderness located in suprapubic area, along the pubic bone and lower abdominal area.  Patient reports his urine stream is weak.  Patient will benefit from skilled therapy to reduce pain with daily activities and how to exercise his abdominals correctly.     History and Personal Factors relevant to plan of care:  on coumadin    Clinical Presentation  Stable    Clinical Presentation due to:  stable condition    Clinical Decision Making  Low    Rehab Potential  Excellent    Clinical Impairments Affecting Rehab Potential  None    PT Frequency  1x / week    PT Duration  8 weeks    PT Treatment/Interventions  Biofeedback;Cryotherapy;Electrical Stimulation;Moist Heat;Ultrasound;Therapeutic exercise;Therapeutic activities;Neuromuscular re-education;Patient/family education;Manual techniques;Taping    PT Next Visit Plan  see if taping is helping, lower abdominal massage, abdominal contraction, hip ER stretch    PT Home Exercise Plan  progress as needed    Consulted and Agree with Plan of Care  Patient       Patient will benefit from skilled therapeutic intervention in order to improve the following deficits and impairments:  Pain, Decreased activity tolerance, Decreased strength, Increased muscle spasms, Increased fascial restricitons  Visit Diagnosis: Cramp and spasm - Plan: PT plan of care cert/re-cert  Muscle weakness (generalized) - Plan: PT plan of care cert/re-cert     Problem List Patient Active Problem List   Diagnosis Date Noted  . Suprapubic pain 04/19/2017  . Bilateral impacted cerumen 04/19/2017  . Bilateral shoulder pain 12/30/2015  . Gross hematuria, painless 02/18/2015  . Preventative health care 06/01/2013  . HTN (hypertension)   . Lupus anticoagulant positive   . Protein C deficiency  (HCC) 05/23/2010  . PULMONARY EMBOLISM, HX OF 04/05/2006    Eulis Foster, PT 06/21/17 8:48 AM   [facility] [address], [address] [place], [place], [address] Phone: [phone]   Fax:  [phone]  Name: Ronnie Fowler MRN: [id number] Date of Birth: 1965-09-26

## 2017-06-25 ENCOUNTER — Ambulatory Visit: Payer: Managed Care, Other (non HMO) | Admitting: Physical Therapy

## 2017-07-09 ENCOUNTER — Ambulatory Visit: Payer: Managed Care, Other (non HMO) | Attending: Urology | Admitting: Physical Therapy

## 2017-07-09 ENCOUNTER — Encounter: Payer: Self-pay | Admitting: Physical Therapy

## 2017-07-09 DIAGNOSIS — M6281 Muscle weakness (generalized): Secondary | ICD-10-CM | POA: Diagnosis present

## 2017-07-09 DIAGNOSIS — R252 Cramp and spasm: Secondary | ICD-10-CM | POA: Diagnosis not present

## 2017-07-09 NOTE — Therapy (Signed)
[facility] [address] [place], [place], [address] Phone: [phone]   Fax:  [phone]  Physical Therapy Treatment  Patient Details  Name: Ronnie Fowler MRN: [id number] Date of Birth: 07/26/1965 Referring Provider: Dr. Hadley PenBrian Budzyn   Encounter Date: 07/09/2017  PT End of Session - 07/09/17 1650    Visit Number  2    Date for PT Re-Evaluation  08/16/17    Authorization Type  [employer]    PT Start Time  1615    PT Stop Time  1648 left early due to PT was done    PT Time Calculation (min)  33 min    Activity Tolerance  Patient tolerated treatment well    Behavior During Therapy  [facility] for tasks assessed/performed       Past Medical History:  Diagnosis Date  . HTN (hypertension)   . Lupus anticoagulant positive   . PE (pulmonary embolism) 03/2002; 05/2005  . Protein C deficiency (HCC)     History reviewed. No pertinent surgical history.  There were no vitals filed for this visit.  Subjective Assessment - 07/09/17 1616    Subjective  the last treatment helped. The tape helped. Less pain with sneezing.  When he has pain during the night he has to go and urinate.     Patient Stated Goals  Reduce pain    Currently in Pain?  Yes    Pain Score  7     Pain Location  Abdomen    Pain Orientation  Lower    Pain Descriptors / Indicators  Sore    Pain Type  Acute pain    Pain Onset  More than a month ago    Pain Frequency  Intermittent    Aggravating Factors   when has to urinat, sneezing, lay supine    Pain Relieving Factors  after urination    Multiple Pain Sites  No                      [facility] Adult PT Treatment/Exercise - 07/09/17 0001      Lumbar Exercises: Stretches   Passive Hamstring Stretch  Left;Right;1 rep;30 seconds    Press Ups  5 reps    Piriformis Stretch  Right;Left;1 rep;30 seconds VC to relax shoulders      Manual Therapy   Manual Therapy  Soft tissue mobilization;Myofascial  release;Taping    Soft tissue mobilization  lower abdomen and along the suprapubic bone    Myofascial Release  lower abdominal tissue to release the tight fascia    Kinesiotex  Inhibit Muscle      Kinesiotix   Inhibit Muscle   rectus abdominus             PT Education - 07/09/17 1650    Education provided  Yes    Education Details  stretches for hips    Person(s) Educated  Patient    Methods  Explanation;Demonstration;Verbal cues;Handout    Comprehension  Returned demonstration;Verbalized understanding       PT Short Term Goals - 07/09/17 1653      PT SHORT TERM GOAL #1   Title  independent with massage of the lower abdomen    Time  4    Period  Weeks    Status  Achieved        PT Long Term Goals - 06/21/17 [id number]      PT LONG TERM GOAL #1   Title  independent with  HEP    Time  8    Period  Weeks    Status  New    Target Date  08/16/17      PT LONG TERM GOAL #2   Title  when sneeze no to minimal pain in the suprapubic area due improved muscle mobility    Time  8    Period  Weeks    Status  New    Target Date  08/16/17      PT LONG TERM GOAL #3   Title  laying down with legs straight pain decreased to minimal to none due to decreased trigger points in the abdominals    Time  8    Period  Weeks    Status  New    Target Date  08/16/17      PT LONG TERM GOAL #4   Title  ability to exercise his abdominals correctly with no pain due to reduction in trigger points    Time  8    Period  Weeks    Status  New    Target Date  08/16/17      PT LONG TERM GOAL #5   Title  improved urine stream to full force prior to pain due to ability to contract the abdominals correctly    Time  8    Period  Weeks    Status  New    Target Date  08/16/17            Plan - 07/09/17 1618    Clinical Impression Statement  Pateint had no pain after therapy.  Patient reports less pain prior to urination at night.  Patient is able to demonstrate his stretches with verbal  cues on keeping his spine straight.  Patient will benefit from skilled therapy to reduce pain with daily activities and how to exercise his abdominals correctly.     Rehab Potential  Excellent    Clinical Impairments Affecting Rehab Potential  None    PT Frequency  1x / week    PT Duration  8 weeks    PT Treatment/Interventions  Biofeedback;Cryotherapy;Electrical Stimulation;Moist Heat;Ultrasound;Therapeutic exercise;Therapeutic activities;Neuromuscular re-education;Patient/family education;Manual techniques;Taping    PT Next Visit Plan   lower abdominal massage, abdominal contraction,     PT Home Exercise Plan  progress as needed    Recommended Other Services  MD signed the initial note    Consulted and Agree with Plan of Care  Patient       Patient will benefit from skilled therapeutic intervention in order to improve the following deficits and impairments:  Pain, Decreased activity tolerance, Decreased strength, Increased muscle spasms, Increased fascial restricitons  Visit Diagnosis: Cramp and spasm  Muscle weakness (generalized)     Problem List Patient Active Problem List   Diagnosis Date Noted  . Suprapubic pain 04/19/2017  . Bilateral impacted cerumen 04/19/2017  . Bilateral shoulder pain 12/30/2015  . Gross hematuria, painless 02/18/2015  . Preventative health care 06/01/2013  . HTN (hypertension)   . Lupus anticoagulant positive   . Protein C deficiency (HCC) 05/23/2010  . PULMONARY EMBOLISM, HX OF 04/05/2006    Eulis Foster, PT 07/09/17 4:54 PM   [facility] [address], [address] [place], [place], [address] Phone: [phone]   Fax:  [phone]  Name: Ronnie Fowler MRN: [id number] Date of Birth: 1965-11-30

## 2017-07-09 NOTE — Patient Instructions (Addendum)
Piriformis Stretch, Sitting    Sit, one ankle on opposite knee, same-side hand on crossed knee. Push down on knee, keeping spine straight. Lean torso forward, with flat back, until tension is felt in hamstrings and gluteals of crossed-leg side. Hold _30__ seconds.  Repeat _2__ times per session. Do __2_ sessions per day.  Copyright  VHI. All rights reserved.  Chair Sitting    Sit at edge of seat, spine straight, one leg extended. Put a hand on each thigh and bend forward from the hip, keeping spine straight. Allow hand on extended leg to reach toward toes. Support upper body with other arm. Hold _30__ seconds. Repeat __2_ times per session. Do _1__ sessions per day.  Copyright  VHI. All rights reserved.  Therapeutic - Prone Press-Up    Push up so chest is off floor and back is arched. Do not raise hips. Hold __3__ seconds. Return to prone position. Repeat __5__ times.  Copyright  VHI. All rights reserved.  Cat / Cow Flow    Inhale, press spine toward ceiling like a Halloween cat. Keeping strength in arms and abdominals, exhale to soften spine through neutral and into cow pose. Open chest and arch back. Initiate movement between cat and cow at tailbone, one vertebrae at a time. Repeat __10__ times.  Copyright  VHI. All rights reserved.  [facility] [address],[facility] [address] [place], [place] [address] Phone # [phone] Fax [phone]

## 2017-07-15 ENCOUNTER — Ambulatory Visit (INDEPENDENT_AMBULATORY_CARE_PROVIDER_SITE_OTHER): Payer: Managed Care, Other (non HMO) | Admitting: Pharmacist

## 2017-07-15 DIAGNOSIS — Z5181 Encounter for therapeutic drug level monitoring: Secondary | ICD-10-CM | POA: Diagnosis not present

## 2017-07-15 DIAGNOSIS — D6859 Other primary thrombophilia: Secondary | ICD-10-CM

## 2017-07-15 DIAGNOSIS — Z7901 Long term (current) use of anticoagulants: Secondary | ICD-10-CM

## 2017-07-15 DIAGNOSIS — Z86711 Personal history of pulmonary embolism: Secondary | ICD-10-CM

## 2017-07-15 LAB — POCT INR: INR: 3

## 2017-07-15 MED ORDER — WARFARIN SODIUM 5 MG PO TABS: ORAL_TABLET | ORAL | 0 refills | Status: DC

## 2017-07-15 NOTE — Progress Notes (Signed)
Anticoagulation Management Ronnie DoveSamuel Adjeteyis a 52 y.o.malewho reports to the clinic for monitoring of warfarintreatment.   Indication:protein C deficiency Duration:indefinite Supervising physician:Lawrence Klima  Anticoagulation Clinic Visit History: Patientdoes notreport signs/symptoms of bleeding or thromboembolism  Other recent changes:Nodiet, medications, lifestylechanges endorsed by the patient to me.  Anticoagulation Episode Summary    Current INR goal:   2.0-3.0  TTR:   91.5 % (7.1 y)  Next INR check:   08/15/2017  INR from last check:   3.0 (07/15/2017)  Weekly max warfarin dose:     Target end date:   Indefinite  INR check location:   [facility]  Preferred lab:     Send INR reminders to:   [name]   Indications   Protein C deficiency (HCC) [D68.59]       Comments:         Anticoagulation Care Providers    Provider Role Specialty Phone number   Burns SpainButcher, Elizabeth A, MD  Internal Medicine [phone]     No Known Allergies Medication Sig  amLODipine (NORVASC) 10 MG tablet TAKE ONE TABLET BY MOUTH ONCE DAILY  fluticasone (FLONASE) 50 MCG/ACT nasal spray 1 spray by Nasal route Daily.  meloxicam (MOBIC) 7.5 MG tablet Take 1 tablet (7.5 mg total) by mouth daily as needed for pain. Patient not taking: Reported on 06/21/2017  warfarin (COUMADIN) 5 MG tablet Take 1 tablet by mouth daily at [facility] on all days of week--EXCEPT on Mondays and Wednesdays, take ONLY 1/2 tablet on these days.   Past Medical History:  Diagnosis Date  . HTN (hypertension)   . Lupus anticoagulant positive   . PE (pulmonary embolism) 03/2002; 05/2005  . Protein C deficiency (HCC)    Social History   Socioeconomic History  . Marital status: Married    Spouse name: Not on file  . Number of children: Not on file  . Years of education: Not on file  . Highest education level: Not on file  Social Needs  . Financial resource strain: Not on file  . Food insecurity - worry:  Not on file  . Food insecurity - inability: Not on file  . Transportation needs - medical: Not on file  . Transportation needs - non-medical: Not on file  Occupational History  . Not on file  Tobacco Use  . Smoking status: Never Smoker  . Smokeless tobacco: Never Used  Substance and Sexual Activity  . Alcohol use: No    Alcohol/week: 0.0 oz  . Drug use: No  . Sexual activity: Not Currently    Partners: Female  Other Topics Concern  . Not on file  Social History Narrative  . Not on file   No family history on file.  ASSESSMENT Recent Results Lab Results  Component Value Date   INR 3.0 07/15/2017   INR 2.4 04/19/2017   INR 1.8 04/12/2017   Anticoagulation Dosing: 30 mg weekly   INR today: Therapeutic  PLAN Weekly dose was unchanged  Patient Instructions  Patient educated about medication as defined in this encounter and verbalized understanding by repeating back instructions provided.   Patient advised to contact clinic or seek medical attention if signs/symptoms of bleeding or thromboembolism occur.  Patient verbalized understanding by repeating back information and was advised to contact me if further medication-related questions arise. Patient was also provided an information handout.  Follow-up Return in about 4 weeks (around 08/15/2017).  Marzetta BoardJennifer [name]

## 2017-07-15 NOTE — Addendum Note (Signed)
Addended by: Mliss FritzKIM, Jeanice Dempsey J on: 07/15/2017 06:24 PM   Modules accepted: Orders

## 2017-07-15 NOTE — Patient Instructions (Signed)
Patient educated about medication as defined in this encounter and verbalized understanding by repeating back instructions provided.   

## 2017-07-16 ENCOUNTER — Ambulatory Visit: Payer: Managed Care, Other (non HMO) | Admitting: Physical Therapy

## 2017-07-16 NOTE — Progress Notes (Signed)
Indication: Protein C deficiency. Duration: Indefinite. INR: At target. Agree with Dr. Elmyra RicksKim's assessment and plan.

## 2017-07-25 ENCOUNTER — Ambulatory Visit: Payer: Managed Care, Other (non HMO) | Admitting: Physical Therapy

## 2017-07-25 ENCOUNTER — Encounter: Payer: Self-pay | Admitting: Physical Therapy

## 2017-07-25 DIAGNOSIS — M6281 Muscle weakness (generalized): Secondary | ICD-10-CM

## 2017-07-25 DIAGNOSIS — R252 Cramp and spasm: Secondary | ICD-10-CM | POA: Diagnosis not present

## 2017-07-25 NOTE — Therapy (Signed)
[facility] [address], [address] [place], [place], [address] Phone: [phone]   Fax:  [phone]  Physical Therapy Treatment  Patient Details  Name: Ronnie Fowler MRN: [id number] Date of Birth: November 24, 1965 Referring Provider: Dr. Hadley Pen   Encounter Date: 07/25/2017  PT End of Session - 07/25/17 1658    Visit Number  3    Date for PT Re-Evaluation  08/16/17    Authorization Type  [employer]    PT Start Time  1615    PT Stop Time  1653    PT Time Calculation (min)  38 min    Activity Tolerance  Patient tolerated treatment well    Behavior During Therapy  [facility] for tasks assessed/performed       Past Medical History:  Diagnosis Date  . HTN (hypertension)   . Lupus anticoagulant positive   . PE (pulmonary embolism) 03/2002; 05/2005  . Protein C deficiency (HCC)     History reviewed. No pertinent surgical history.  There were no vitals filed for this visit.  Subjective Assessment - 07/25/17 1628    Subjective  My pain is 50% better.     Patient Stated Goals  Reduce pain    Pain Score  4     Pain Location  Abdomen    Pain Orientation  Lower    Pain Descriptors / Indicators  Sore    Pain Type  Acute pain    Pain Onset  More than a month ago    Pain Frequency  Intermittent    Aggravating Factors   sneeze, lay on back    Pain Relieving Factors  after urination    Multiple Pain Sites  No                      [facility] Adult PT Treatment/Exercise - 07/25/17 0001      Lumbar Exercises: Aerobic   Nustep  6 min level 3, seat#9, arm #10      Manual Therapy   Manual Therapy  Soft tissue mobilization;Myofascial release    Soft tissue mobilization  lower abdomen and along the suprapubic bone    Myofascial Release  lower abdominal tissue to release the tight fascia             PT Education - 07/25/17 1657    Education provided  Yes    Education Details  abdominal exercises, quadruped lift  alternate extremity    Person(s) Educated  Patient    Methods  Explanation;Demonstration;Verbal cues    Comprehension  Verbalized understanding;Returned demonstration       PT Short Term Goals - 07/25/17 1630      PT SHORT TERM GOAL #2   Title  understand how to brace abdominals with minimal pain    Time  4    Period  Weeks    Status  Achieved        PT Long Term Goals - 07/25/17 1631      PT LONG TERM GOAL #2   Title  when sneeze no to minimal pain in the suprapubic area due improved muscle mobility    Time  8    Period  Weeks    Status  On-going      PT LONG TERM GOAL #3   Title  laying down with legs straight pain decreased to minimal to none due to decreased trigger points in the abdominals    Time  8    Period  Weeks    Status  On-going      PT LONG TERM GOAL #4   Title  ability to exercise his abdominals correctly with no pain due to reduction in trigger points    Time  8    Period  Weeks    Status  On-going      PT LONG TERM GOAL #5   Title  improved urine stream to full force prior to pain due to ability to contract the abdominals correctly    Time  8    Period  Weeks    Status  On-going            Plan - 07/25/17 1658    Clinical Impression Statement  Patient reports his pain is 50% better.  Patient will have increased pain with bladder filled with urine or abdominal contraction when legs are extended.  Patient has tightness suprapubically that responds to soft tissue work. Patient will benefit from skilled therapy to reduce pain with daily activities and how to exercise his abdominals correctly.     Rehab Potential  Excellent    PT Frequency  1x / week    PT Duration  8 weeks    PT Treatment/Interventions  Biofeedback;Cryotherapy;Electrical Stimulation;Moist Heat;Ultrasound;Therapeutic exercise;Therapeutic activities;Neuromuscular re-education;Patient/family education;Manual techniques;Taping    PT Next Visit Plan   lower abdominal massage, abdominal  contraction advancement     PT Home Exercise Plan  progress as needed    Consulted and Agree with Plan of Care  Patient       Patient will benefit from skilled therapeutic intervention in order to improve the following deficits and impairments:  Pain, Decreased activity tolerance, Decreased strength, Increased muscle spasms, Increased fascial restricitons  Visit Diagnosis: Cramp and spasm  Muscle weakness (generalized)     Problem List Patient Active Problem List   Diagnosis Date Noted  . Suprapubic pain 04/19/2017  . Bilateral impacted cerumen 04/19/2017  . Bilateral shoulder pain 12/30/2015  . Gross hematuria, painless 02/18/2015  . Preventative health care 06/01/2013  . HTN (hypertension)   . Lupus anticoagulant positive   . Protein C deficiency (HCC) 05/23/2010  . PULMONARY EMBOLISM, HX OF 04/05/2006    Eulis Fosterheryl Eller Sweis, PT 07/25/17 5:01 PM   [facility] [address], [address] [place], [place], [address] Phone: [phone]   Fax:  [phone]  Name: Ronnie ShepherdSamuel Fowler MRN: [id number] Date of Birth: [birth year]

## 2017-07-25 NOTE — Patient Instructions (Addendum)
Bracing With Knee Fallout (Hook-Lying)    With neutral spine, tighten pelvic floor and abdominals and hold. Alternating legs, drop knee out to side. Keep opposite hip still. Repeat _15__ times. Do _1__ times a day.   Copyright  VHI. All rights reserved.  Bracing With Leg March (Hook-Lying)    With neutral spine, tighten pelvic floor and abdominals and hold. Alternating legs, lift foot _6__ inches and return to floor. Repeat _15__ times. Do _1__ times a day.   Copyright  VHI. All rights reserved.  Bracing With March in Bridging (Hook-Lying)    With neutral spine, tighten pelvic floor and abdominals and hold. Lift bottom and hold, then march in place. March _15__ times. Do _1__ times a day.   Copyright  VHI. All rights reserved.  Bracing With Arm / Leg Raise (Quadruped)    On hands and knees find neutral spine. Tighten pelvic floor and abdominals and hold. Alternating, lift arm to shoulder level and opposite leg to hip level. Repeat __10_ times. Do _1__ times a day.   Copyright  VHI. All rights reserved.  [facility] [address], [address] [place], [place] [address] Phone # [phone] Fax [phone]

## 2017-08-01 ENCOUNTER — Ambulatory Visit: Payer: Managed Care, Other (non HMO) | Admitting: Physical Therapy

## 2017-08-01 DIAGNOSIS — R252 Cramp and spasm: Secondary | ICD-10-CM | POA: Diagnosis not present

## 2017-08-01 DIAGNOSIS — M6281 Muscle weakness (generalized): Secondary | ICD-10-CM

## 2017-08-01 NOTE — Therapy (Addendum)
[facility] [address], [place], [place], [address] Phone: [phone]   Fax:  [phone]  Physical Therapy Treatment  Patient Details  Name: Ronnie Fowler MRN: [id number] Date of Birth: 05-28-1965 Referring Provider: Dr. Baruch Gouty   Encounter Date: 08/01/2017  PT End of Session - 08/01/17 1657    Visit Number  4    Date for PT Re-Evaluation  08/16/17    Authorization Type  [employer]    PT Start Time  1615    PT Stop Time  1658    PT Time Calculation (min)  43 min    Activity Tolerance  Patient tolerated treatment well    Behavior During Therapy  [facility] for tasks assessed/performed       Past Medical History:  Diagnosis Date  . HTN (hypertension)   . Lupus anticoagulant positive   . PE (pulmonary embolism) 03/2002; 05/2005  . Protein C deficiency ([place])     No past surgical history on file.  There were no vitals filed for this visit.  Subjective Assessment - 08/01/17 1620    Subjective  I am noticing improvement. My pain is going down.  Less pain when I go to the gym. Sneeze 4-5/10.     Patient Stated Goals  Reduce pain    Currently in Pain?  Yes    Pain Score  3     Pain Location  Abdomen    Pain Orientation  Lower    Pain Descriptors / Indicators  Sore    Pain Type  Acute pain    Pain Onset  More than a month ago    Pain Frequency  Intermittent    Aggravating Factors   sneeze, lay on back    Pain Relieving Factors  after urination    Multiple Pain Sites  No                No data recorded       [facility] Adult PT Treatment/Exercise - 08/01/17 0001      Exercises   Exercises  Other Exercises    Other Exercises   abdominal bracing with abdominal curl-up and diagonal without pain and correct breathing, not letting the abdomen expand instead sink down to table      Lumbar Exercises: Aerobic   Nustep  6 min level 4, seat#9, arm #10             PT Education - 08/01/17 1656    Education provided  Yes    Education Details  abdominal curl-up and diagonal phase 1    Person(s) Educated  Patient    Methods  Explanation;Demonstration;Verbal cues;Handout    Comprehension  Returned demonstration;Verbalized understanding       PT Short Term Goals - 07/25/17 1630      PT SHORT TERM GOAL #2   Title  understand how to brace abdominals with minimal pain    Time  4    Period  Weeks    Status  Achieved        PT Long Term Goals - 08/01/17 1700      PT LONG TERM GOAL #5   Title  improved urine stream to full force prior to pain due to ability to contract the abdominals correctly    Time  8    Period  Weeks    Status  Achieved            Plan - 08/01/17 1658    Clinical  Impression Statement  Patient is having less pain with sneezing and he is noticing results.  Patient has tenderness located at the insertion of the abdominals to the pubic bone.  When patient lifts his head in supine, he will push his abdominals out intead of bracing them causing increased pain.  Patient still needs to learn how to contract the abdomen correctly with activitis.     Rehab Potential  Excellent    Clinical Impairments Affecting Rehab Potential  None    PT Frequency  1x / week    PT Duration  8 weeks    PT Treatment/Interventions  Biofeedback;Cryotherapy;Electrical Stimulation;Moist Heat;Ultrasound;Therapeutic exercise;Therapeutic activities;Neuromuscular re-education;Patient/family education;Manual techniques;Taping    PT Next Visit Plan   lower abdominal massage, abdominal contraction advancement phase 2, abdominal brace with sneeze and cough    PT Home Exercise Plan  progress as needed    Consulted and Agree with Plan of Care  Patient       Patient will benefit from skilled therapeutic intervention in order to improve the following deficits and impairments:  Pain, Decreased activity tolerance, Decreased strength, Increased muscle spasms, Increased fascial restricitons  Visit  Diagnosis: Cramp and spasm  Muscle weakness (generalized)     Problem List Patient Active Problem List   Diagnosis Date Noted  . Suprapubic pain 04/19/2017  . Bilateral impacted cerumen 04/19/2017  . Bilateral shoulder pain 12/30/2015  . Gross hematuria, painless 02/18/2015  . Preventative health care 06/01/2013  . HTN (hypertension)   . Lupus anticoagulant positive   . Protein C deficiency ([place]) 05/23/2010  . PULMONARY EMBOLISM, HX OF 04/05/2006    Earlie Counts, PT 08/01/17 5:02 PM   [facility] [address], [place], [place], [address] Phone: [phone]   Fax:  [phone]  Name: Ronnie Fowler MRN: [id number] Date of Birth: 1966/02/26 PHYSICAL THERAPY DISCHARGE SUMMARY  Visits from Start of Care: 4  Current functional level related to goals / functional outcomes: See above.    Remaining deficits: See above.  Unable to assess patient due to not attending his last visit and cancelled it. Patient has not been back since 08/01/2017.    Education / Equipment: HEP Plan: Patient agrees to discharge.  Patient goals were partially met. Patient is being discharged due to not returning since the last visit.  Thank you for the referral. Earlie Counts, PT 08/20/17 2:21 PM  ?????

## 2017-08-01 NOTE — Patient Instructions (Addendum)
Curl-Up: Phase 1    No pain.  With arms at sides, tilt pelvis to flatten back. Breathe out. Raise head and shoulders from floor. Use arms to support trunk if necessary. Repeat __10__ times per set. Do ___1_ sets per session. Do ___1_ sessions per day.  http://orth.exer.us/136   Copyright  VHI. All rights reserved.  Diagonal Curl-Up: Phase 1    No pain.  With arms at sides, tilt pelvis to flatten back. Breath out. Raise head and shoulders, rotating to left side as shoulder blades clear floor.Slide right hand on table.  Repeat _10___ times per set. Do ___1_ sets per session. Do __10__ sessions per day. Then do the other side.  http://orth.exer.us/138   Copyright  VHI. All rights reserved.  [facility] [address], [address] [place], [place] [address] Phone # [phone] Fax [phone]

## 2017-08-08 ENCOUNTER — Encounter: Payer: Managed Care, Other (non HMO) | Admitting: Physical Therapy

## 2017-08-15 ENCOUNTER — Ambulatory Visit (INDEPENDENT_AMBULATORY_CARE_PROVIDER_SITE_OTHER): Payer: Managed Care, Other (non HMO) | Admitting: Internal Medicine

## 2017-08-15 ENCOUNTER — Encounter: Payer: Self-pay | Admitting: Internal Medicine

## 2017-08-15 ENCOUNTER — Ambulatory Visit (INDEPENDENT_AMBULATORY_CARE_PROVIDER_SITE_OTHER): Payer: Managed Care, Other (non HMO) | Admitting: Pharmacist

## 2017-08-15 VITALS — BP 225/108 | HR 63 | Temp 99.0°F | Wt 187.4 lb

## 2017-08-15 DIAGNOSIS — Z0000 Encounter for general adult medical examination without abnormal findings: Secondary | ICD-10-CM

## 2017-08-15 DIAGNOSIS — D6859 Other primary thrombophilia: Secondary | ICD-10-CM

## 2017-08-15 DIAGNOSIS — Z23 Encounter for immunization: Secondary | ICD-10-CM | POA: Diagnosis not present

## 2017-08-15 DIAGNOSIS — Z7901 Long term (current) use of anticoagulants: Secondary | ICD-10-CM | POA: Diagnosis not present

## 2017-08-15 DIAGNOSIS — I10 Essential (primary) hypertension: Secondary | ICD-10-CM

## 2017-08-15 DIAGNOSIS — Z5181 Encounter for therapeutic drug level monitoring: Secondary | ICD-10-CM

## 2017-08-15 DIAGNOSIS — Z79899 Other long term (current) drug therapy: Secondary | ICD-10-CM

## 2017-08-15 LAB — POCT INR: INR: 2.1

## 2017-08-15 MED ORDER — LISINOPRIL 10 MG PO TABS: 10.0000 mg | ORAL_TABLET | Freq: Every day | ORAL | 1 refills | Status: AC

## 2017-08-15 NOTE — Assessment & Plan Note (Signed)
-   TDAP given - HIV screen to be performed at follow as he will be having blood drawn for BMP - Patient deferred Colonoscopy discussion until next visit.

## 2017-08-15 NOTE — Patient Instructions (Signed)
Patient instructed to take medications as defined in the Anti-coagulation Track section of this encounter.  Patient instructed to take today's dose.  Patient verbalized understanding of these instructions.  Take one-half of your 5mg  tablets on Mondays and the entire tablet on all other days. Today take 7.5mg  (1 and 1/2 tablet) to trend INR back to goal.

## 2017-08-15 NOTE — Assessment & Plan Note (Addendum)
Initial BP 207/98, however, he stated he had been rushing as he was late for his appointment. On manual recheck, BP remained elevated at 220/108. He states he has been taking his amlodipine 10mg  daily. He checks his BP at home a reports Systolic pressures in the 130s-140s. He denies headache, weakness, chest pain, palpitations, vision changes, or shortness of breath. With his very elevated BP today, will add Lisinopril to his regimen. We were unable to observe patient in clinic as it was near end of business. In leu of sending patient directly to the ED; as the patient was asymptomatic, he was sent home with strict return precautions and told to be evaluated in the ED if he expereices any of the above or other concerning symptoms. He stated he would check his blood pressure at home and call to report his blood pressure. He will also be following up in 2 weeks for BP check and labs if not earlier. - Amlodipine 10mg  Daily - Lisinopril 10mg  Daily - Strict return precautions - Follow up in 2 weeks for BP check and BMP

## 2017-08-15 NOTE — Patient Instructions (Addendum)
Thank you for allowing us to care for you  For your high blood pressure: - Your blood pressure was very elevated today, 200/98 and 220/108 - Please continue to take amlodipine daily - Please start taking Lisinopril 10mg  Daily - If you experience Headaches, Weakness, Shortness of breath, Chest pain, or other concerning symptoms please go to the emergency department for evaluation - Follow up in 2 weeks for blood pressure recheck and labs  You received your TDAP vaccine today.  FOLLOW-UP INSTRUCTIONS When: 2 weeks For: BP recheck, BMP, HIV Screen What to bring: Home blood pressure cuff

## 2017-08-15 NOTE — Progress Notes (Signed)
   CC: Hypertension, Preventative Health Care  HPI:  Mr.Jathen Pleas Kochdjetey is a 52 y.o. M with PMHx listed below presenting for Hypertension, Preventative Health Care. Please see the A&P for the status of the patient's chronic medical problems.   Past Medical History:  Diagnosis Date  . HTN (hypertension)   . Lupus anticoagulant positive   . PE (pulmonary embolism) 03/2002; 05/2005  . Protein C deficiency (HCC)    Review of Systems: Performed and all others negative.  Physical Exam:  Vitals:   08/15/17 1610 08/15/17 1625  BP: (!) 207/98 (!) 225/108  Pulse: 70 63  Temp: 99 F (37.2 C)   SpO2: 100%   Weight: 187 lb 6.4 oz (85 kg)    Physical Exam  Constitutional: He is oriented to person, place, and time. He appears well-developed and well-nourished. No distress.  HENT:  Head: Normocephalic and atraumatic.  Cardiovascular: Normal rate, regular rhythm, normal heart sounds and intact distal pulses.  Pulmonary/Chest: Effort normal and breath sounds normal. No respiratory distress.  Abdominal: Soft. Bowel sounds are normal. He exhibits no distension.  Musculoskeletal: He exhibits no edema or deformity.  Neurological: He is alert and oriented to person, place, and time.  Skin: Skin is warm and dry.    Assessment & Plan:   See Encounters Tab for problem based charting.  Patient discussed with Dr. Sandre Kittyaines

## 2017-08-15 NOTE — Progress Notes (Addendum)
Anticoagulation Management Ronnie ShepherdSamuel Fowler is a 52 y.o. male who reports to the clinic for monitoring of warfarin treatment.    Indication: protein C deficiency  Duration: indefinite Supervising physician: Carlynn PurlErik Hoffman  Anticoagulation Clinic Visit History: Patient does not report signs/symptoms of bleeding or thromboembolism. Other recent changes: Denies diet, medications, lifestyle changes Anticoagulation Episode Summary    Current INR goal:   2.0-3.0  TTR:   91.6 % (7.1 y)  Next INR check:   09/12/2017  INR from last check:   2.1 (08/15/2017)  Weekly max warfarin dose:     Target end date:   Indefinite  INR check location:   Anticoagulation Clinic  Preferred lab:     Send INR reminders to:   [name]   Indications   Protein C deficiency [facility](HCC) [D68.59]       Comments:         Anticoagulation Care Providers    Provider Role Specialty Phone number   Burns SpainButcher, Elizabeth A, MD  Internal Medicine [phone]      No Known Allergies Prior to Admission medications   Medication Sig Start Date End Date Taking? Authorizing Provider  amLODipine (NORVASC) 10 MG tablet TAKE ONE TABLET BY MOUTH ONCE DAILY 06/18/16   Beather ArbourPatel, Rushil V, MD  fluticasone (FLONASE) 50 MCG/ACT nasal spray 1 spray by Nasal route Daily. 07/20/10   [provider]  lisinopril (PRINIVIL,ZESTRIL) 10 MG tablet Take 1 tablet (10 mg total) by mouth daily. 08/15/17   Beola CordMelvin, Alexander, MD  meloxicam (MOBIC) 7.5 MG tablet Take 1 tablet (7.5 mg total) by mouth daily as needed for pain. Patient not taking: Reported on 06/21/2017 12/30/15   Beather ArbourPatel, Rushil V, MD  warfarin (COUMADIN) 5 MG tablet Take 1 tablet by mouth daily at [facility]6PM on all days of week--EXCEPT on Mondays and Wednesdays, take ONLY 1/2 tablet on these days. 07/15/17   Mliss FritzKim, [name], PharmD   Past Medical History:  Diagnosis Date  . HTN (hypertension)   . Lupus anticoagulant positive   . PE (pulmonary embolism) 03/2002; 05/2005  . Protein C deficiency  (HCC)    Social History   Socioeconomic History  . Marital status: Married    Spouse name: Not on file  . Number of children: Not on file  . Years of education: Not on file  . Highest education level: Not on file  Occupational History  . Not on file  Social Needs  . Financial resource strain: Not on file  . Food insecurity:    Worry: Not on file    Inability: Not on file  . Transportation needs:    Medical: Not on file    Non-medical: Not on file  Tobacco Use  . Smoking status: Never Smoker  . Smokeless tobacco: Never Used  Substance and Sexual Activity  . Alcohol use: No    Alcohol/week: 0.0 oz  . Drug use: No  . Sexual activity: Not Currently    Partners: Female  Lifestyle  . Physical activity:    Days per week: Not on file    Minutes per session: Not on file  . Stress: Not on file  Relationships  . Social connections:    Talks on phone: Not on file    Gets together: Not on file    Attends religious service: Not on file    Active member of club or organization: Not on file    Attends meetings of clubs or organizations: Not on file    Relationship status: Not  on file  Other Topics Concern  . Not on file  Social History Narrative  . Not on file   No family history on file.  ASSESSMENT Recent Results: The most recent result is correlated with 30 mg per week: Lab Results  Component Value Date   INR 2.1 08/15/2017   INR 3.0 07/15/2017   INR 2.4 04/19/2017    Anticoagulation Dosing: Description   Take one-half of your 5 mg tablets on Mondays and the entire tablet on all other days.     INR today: Therapeutic (His INR goal is 2.0-3.0)  PLAN Weekly dose was increased by 8% to 32.5 mg per week  Patient Instructions  Patient instructed to take medications as defined in the Anti-coagulation Track section of this encounter.  Patient instructed to take today's dose.  Patient verbalized understanding of these instructions.  Take one-half of your 5mg   tablets on Mondays and the entire tablet on all other days. Today take 7.5mg  (1 and 1/2 tablet) to trend INR back to goal.  Patient advised to contact clinic or seek medical attention if signs/symptoms of bleeding or thromboembolism occur.  Patient verbalized understanding by repeating back information and was advised to contact me if further medication-related questions arise. Patient was also provided an information handout.  Follow-up Return in about 1 month (around 09/12/2017) for INR f/u on 5/9 @1600 .  Nolen Mu PharmD PGY1 Pharmacy Practice Resident 08/15/2017 5:16 PM Pager: [phone]  15 minutes spent face-to-face with the patient during the encounter. 50% of time spent on education. 50% of time was spent on collection of INR, interpretation of results and documentation.

## 2017-08-16 ENCOUNTER — Telehealth: Payer: Self-pay

## 2017-08-16 NOTE — Telephone Encounter (Signed)
Missed call. Left a voice message to call us back for anticoagulation survey completion.

## 2017-08-19 NOTE — Progress Notes (Signed)
INTERNAL MEDICINE TEACHING ATTENDING ADDENDUM - Gust RungHoffman, Erik C, DO Duration- indefinate, Indication- PE, protein C def, INR-  Lab Results  Component Value Date   INR 2.1 08/15/2017  . Agree with pharmacy recommendations as outlined in their note.

## 2017-08-22 ENCOUNTER — Encounter: Payer: Managed Care, Other (non HMO) | Admitting: Internal Medicine

## 2017-08-26 NOTE — Progress Notes (Signed)
Internal Medicine Clinic Attending  Case discussed with Dr. Alinda MoneyMelvin  at the time of the visit.  We reviewed the resident's history and exam and pertinent patient test results.  I agree with the assessment, diagnosis, and plan of care documented in the resident's note.  Although BP of >200/100 is alarming, he is asymptomatic and recent guidelines from the [employer] support outpatient management of hypertensive urgency with increased blood pressure regimen and close follow up. Health care maintenance also addressed as per Dr. Vesta MixerMelvin's note.  Anne ShutterAlexander N Raines, MD

## 2017-09-12 ENCOUNTER — Ambulatory Visit: Payer: Managed Care, Other (non HMO) | Admitting: Pharmacist

## 2017-10-21 ENCOUNTER — Ambulatory Visit (INDEPENDENT_AMBULATORY_CARE_PROVIDER_SITE_OTHER): Payer: Managed Care, Other (non HMO) | Admitting: Pharmacist

## 2017-10-21 DIAGNOSIS — D6859 Other primary thrombophilia: Secondary | ICD-10-CM | POA: Diagnosis not present

## 2017-10-21 DIAGNOSIS — Z86711 Personal history of pulmonary embolism: Secondary | ICD-10-CM

## 2017-10-21 LAB — POCT INR: INR: 2.6 (ref 2.0–3.0)

## 2017-10-21 MED ORDER — WARFARIN SODIUM 5 MG PO TABS
ORAL_TABLET | ORAL | 0 refills | Status: DC
Start: 2017-10-21 — End: 2017-12-16

## 2017-10-21 NOTE — Progress Notes (Signed)
Anticoagulation Management Ronnie ShepherdSamuel Fowler is a 52 y.o. male who reports to the clinic for monitoring of warfarin treatment.    Indication: protein C deficiency , long term current use of anticoagulant.  Duration: indefinite Supervising physician: Blanch MediaElizabeth Butcher  Anticoagulation Clinic Visit History: Patient does not report signs/symptoms of bleeding or thromboembolism  Other recent changes: No diet, medications, lifestyle changes.  Anticoagulation Episode Summary    Current INR goal:   2.0-3.0  TTR:   91.8 % (7.3 y)  Next INR check:   12/16/2017  INR from last check:   2.6 (10/21/2017)  Weekly max warfarin dose:     Target end date:   Indefinite  INR check location:   Anticoagulation Clinic  Preferred lab:     Send INR reminders to:   [name]   Indications   Protein C deficiency [facility](HCC) [D68.59]       Comments:         Anticoagulation Care Providers    Provider Role Specialty Phone number   Burns SpainButcher, Elizabeth A, MD  Internal Medicine [phone]      No Known Allergies Prior to Admission medications   Medication Sig Start Date End Date Taking? Authorizing Provider  amLODipine (NORVASC) 10 MG tablet TAKE ONE TABLET BY MOUTH ONCE DAILY 06/18/16  Yes Beather ArbourPatel, Rushil V, MD  warfarin (COUMADIN) 5 MG tablet Take 1 tablet by mouth daily at [facility]6PM on all days of week--EXCEPT on Mondays  take ONLY 1/2 tablet on Mondays. Dispense 3 month supply. 10/21/17  Yes Elicia LampGroce, Deidra Spease B, RPH-CPP  fluticasone (FLONASE) 50 MCG/ACT nasal spray 1 spray by Nasal route Daily. 07/20/10   [provider]  lisinopril (PRINIVIL,ZESTRIL) 10 MG tablet Take 1 tablet (10 mg total) by mouth daily. Patient not taking: Reported on 10/21/2017 08/15/17   Beola CordMelvin, Alexander, MD  meloxicam (MOBIC) 7.5 MG tablet Take 1 tablet (7.5 mg total) by mouth daily as needed for pain. Patient not taking: Reported on 06/21/2017 12/30/15   Beather ArbourPatel, Rushil V, MD   Past Medical History:  Diagnosis Date  . HTN (hypertension)   .  Lupus anticoagulant positive   . PE (pulmonary embolism) 03/2002; 05/2005  . Protein C deficiency (HCC)    Social History   Socioeconomic History  . Marital status: Married    Spouse name: Not on file  . Number of children: Not on file  . Years of education: Not on file  . Highest education level: Not on file  Occupational History  . Not on file  Social Needs  . Financial resource strain: Not on file  . Food insecurity:    Worry: Not on file    Inability: Not on file  . Transportation needs:    Medical: Not on file    Non-medical: Not on file  Tobacco Use  . Smoking status: Never Smoker  . Smokeless tobacco: Never Used  Substance and Sexual Activity  . Alcohol use: No    Alcohol/week: 0.0 oz  . Drug use: No  . Sexual activity: Not Currently    Partners: Female  Lifestyle  . Physical activity:    Days per week: Not on file    Minutes per session: Not on file  . Stress: Not on file  Relationships  . Social connections:    Talks on phone: Not on file    Gets together: Not on file    Attends religious service: Not on file    Active member of club or organization: Not on file  Attends meetings of clubs or organizations: Not on file    Relationship status: Not on file  Other Topics Concern  . Not on file  Social History Narrative  . Not on file   No family history on file.  ASSESSMENT Recent Results: The most recent result is correlated with 32.5 mg per week: Lab Results  Component Value Date   INR 2.6 10/21/2017   INR 2.1 08/15/2017   INR 3.0 07/15/2017    Anticoagulation Dosing: Description   Take one-half of your 5 mg tablets on Mondays and the entire tablet on all other days.     INR today: Therapeutic  PLAN Weekly dose was unchanged.  Patient Instructions  Patient instructed to take medications as defined in the Anti-coagulation Track section of this encounter.  Patient instructed to take today's dose.  Patient instructed to take one-half of  your 5 mg tablets on Mondays and the entire tablet on all other days. Patient verbalized understanding of these instructions.     Patient advised to contact clinic or seek medical attention if signs/symptoms of bleeding or thromboembolism occur.  Patient verbalized understanding by repeating back information and was advised to contact me if further medication-related questions arise. Patient was also provided an information handout.  Follow-up Return in 8 weeks (on 12/16/2017) for Follow up INR at 4:30PM.  Elicia Lamp, PharmD, [name], CPP  15 minutes spent face-to-face with the patient during the encounter. 50% of time spent on education. 50% of time was spent on fingerstick point of care INR sample collection, processing, results determination, documentation in [email or web address] and refill authorization to his pharmacy.

## 2017-10-21 NOTE — Patient Instructions (Signed)
Patient instructed to take medications as defined in the Anti-coagulation Track section of this encounter.  Patient instructed to take today's dose.  Patient instructed to take one-half of your 5 mg tablets on Mondays and the entire tablet on all other days. Patient verbalized understanding of these instructions.    

## 2017-11-05 ENCOUNTER — Other Ambulatory Visit: Payer: Self-pay | Admitting: *Deleted

## 2017-11-05 DIAGNOSIS — I10 Essential (primary) hypertension: Secondary | ICD-10-CM

## 2017-11-06 MED ORDER — AMLODIPINE BESYLATE 10 MG PO TABS
10.0000 mg | ORAL_TABLET | Freq: Every day | ORAL | 3 refills | Status: AC
Start: 2017-11-06 — End: ?

## 2017-11-06 NOTE — Telephone Encounter (Signed)
Refill Approved. Please instruct patient to schedule a follow up appointment for blood pressure recheck and BMP. His blood pressure was very elevated at previous visit and he was instructed to follow up 2 weeks after that visit.  Ginette OttoAlec Melvin

## 2017-11-06 NOTE — Telephone Encounter (Signed)
Pleas schedule pt an appt per Dr Alinda MoneyMelvin. Thanks

## 2017-11-29 ENCOUNTER — Ambulatory Visit: Payer: Managed Care, Other (non HMO)

## 2017-12-16 ENCOUNTER — Ambulatory Visit (INDEPENDENT_AMBULATORY_CARE_PROVIDER_SITE_OTHER): Payer: Managed Care, Other (non HMO) | Admitting: Pharmacist

## 2017-12-16 DIAGNOSIS — Z5181 Encounter for therapeutic drug level monitoring: Secondary | ICD-10-CM | POA: Diagnosis not present

## 2017-12-16 DIAGNOSIS — Z7901 Long term (current) use of anticoagulants: Secondary | ICD-10-CM

## 2017-12-16 DIAGNOSIS — Z86711 Personal history of pulmonary embolism: Secondary | ICD-10-CM

## 2017-12-16 DIAGNOSIS — D6859 Other primary thrombophilia: Secondary | ICD-10-CM | POA: Diagnosis not present

## 2017-12-16 LAB — POCT INR: INR: 2.8 (ref 2.0–3.0)

## 2017-12-16 MED ORDER — WARFARIN SODIUM 5 MG PO TABS: ORAL_TABLET | ORAL | 1 refills | Status: DC

## 2017-12-16 NOTE — Progress Notes (Signed)
Anticoagulation Management Ronnie Fowler is a 52 y.o. male who reports to the clinic for monitoring of warfarin treatment.    Indication: protein C deficiency; History of VTE; Long term current use of anticoagulant.   Duration: indefinite Supervising physician: [name]  Anticoagulation Clinic Visit History: Patient does not report signs/symptoms of bleeding or thromboembolism  Other recent changes: No diet, medications, lifestyle changes endorsed.  Anticoagulation Episode Summary    Current INR goal:   2.0-3.0  TTR:   92.0 % (7.5 y)  Next INR check:   02/10/2018  INR from last check:     Weekly max warfarin dose:     Target end date:   Indefinite  INR check location:   Anticoagulation Clinic  Preferred lab:     Send INR reminders to:   [name]   Indications   Protein C deficiency [facility](HCC) [D68.59]       Comments:         Anticoagulation Care Providers    Provider Role Specialty Phone number   Burns SpainButcher, Elizabeth A, MD  Internal Medicine [phone]      No Known Allergies Prior to Admission medications   Medication Sig Start Date End Date Taking? Authorizing Provider  amLODipine (NORVASC) 10 MG tablet Take 1 tablet (10 mg total) by mouth daily. 11/06/17  Yes Beola CordMelvin, Alexander, MD  fluticasone [facility](FLONASE) 50 MCG/ACT nasal spray 1 spray by Nasal route Daily. 07/20/10  Yes [provider]  lisinopril (PRINIVIL,ZESTRIL) 10 MG tablet Take 1 tablet (10 mg total) by mouth daily. 08/15/17  Yes Beola CordMelvin, Alexander, MD  meloxicam (MOBIC) 7.5 MG tablet Take 1 tablet (7.5 mg total) by mouth daily as needed for pain. 12/30/15  Yes Beather ArbourPatel, Rushil V, MD  warfarin (COUMADIN) 5 MG tablet Take 1 tablet by mouth daily at [facility]6PM on all days of week--EXCEPT on Mondays  take ONLY 1/2 tablet on Mondays. Dispense 3 month supply. 12/16/17  Yes Elicia LampGroce, Kyandra Mcclaine B, RPH-CPP   Past Medical History:  Diagnosis Date  . HTN (hypertension)   . Lupus anticoagulant positive   . PE (pulmonary embolism)  03/2002; 05/2005  . Protein C deficiency (HCC)    Social History   Socioeconomic History  . Marital status: Married    Spouse name: Not on file  . Number of children: Not on file  . Years of education: Not on file  . Highest education level: Not on file  Occupational History  . Not on file  Social Needs  . Financial resource strain: Not on file  . Food insecurity:    Worry: Not on file    Inability: Not on file  . Transportation needs:    Medical: Not on file    Non-medical: Not on file  Tobacco Use  . Smoking status: Never Smoker  . Smokeless tobacco: Never Used  Substance and Sexual Activity  . Alcohol use: No    Alcohol/week: 0.0 standard drinks  . Drug use: No  . Sexual activity: Not Currently    Partners: Female  Lifestyle  . Physical activity:    Days per week: Not on file    Minutes per session: Not on file  . Stress: Not on file  Relationships  . Social connections:    Talks on phone: Not on file    Gets together: Not on file    Attends religious service: Not on file    Active member of club or organization: Not on file    Attends meetings of clubs or  organizations: Not on file    Relationship status: Not on file  Other Topics Concern  . Not on file  Social History Narrative  . Not on file   No family history on file.  ASSESSMENT Recent Results: The most recent result is correlated with 32.5 mg per week: Lab Results  Component Value Date   INR 2.8 12/16/2017   INR 2.6 10/21/2017   INR 2.1 08/15/2017    Anticoagulation Dosing: Description   Take one-half of your 5 mg tablets on Mondays and the entire tablet on all other days.     INR today: Therapeutic  PLAN Weekly dose was unchanged.   Patient Instructions  Patient instructed to take medications as defined in the Anti-coagulation Track section of this encounter.  Patient instructed to take today's dose.  Patient instructed to take one-half of your 5 mg tablets on Mondays and the entire  tablet on all other days. Patient verbalized understanding of these instructions.     Patient advised to contact clinic or seek medical attention if signs/symptoms of bleeding or thromboembolism occur.  Patient verbalized understanding by repeating back information and was advised to contact me if further medication-related questions arise. Patient was also provided an information handout.  Follow-up Return in 8 weeks (on 02/10/2018) for Follow up INR at  4:15PM.  Elicia LampJames B Janeli Fowler, PharmD, CPP  15 minutes spent face-to-face with the patient during the encounter. 50% of time spent on education. 50% of time was spent on fingerstick point of care INR sample collection, processing, results determination and documentation in [email or web address].

## 2017-12-16 NOTE — Patient Instructions (Signed)
Patient instructed to take medications as defined in the Anti-coagulation Track section of this encounter.  Patient instructed to take today's dose.  Patient instructed to take one-half of your 5 mg tablets on Mondays and the entire tablet on all other days. Patient verbalized understanding of these instructions.

## 2018-02-10 ENCOUNTER — Ambulatory Visit: Payer: Managed Care, Other (non HMO)

## 2018-07-23 ENCOUNTER — Encounter: Payer: Self-pay | Admitting: Pharmacist

## 2018-07-23 ENCOUNTER — Other Ambulatory Visit: Payer: Self-pay | Admitting: Pharmacist

## 2018-07-23 DIAGNOSIS — D6859 Other primary thrombophilia: Secondary | ICD-10-CM

## 2018-07-23 DIAGNOSIS — Z86718 Personal history of other venous thrombosis and embolism: Secondary | ICD-10-CM

## 2018-07-23 DIAGNOSIS — R760 Raised antibody titer: Secondary | ICD-10-CM

## 2018-07-23 DIAGNOSIS — Z7901 Long term (current) use of anticoagulants: Secondary | ICD-10-CM

## 2018-09-25 ENCOUNTER — Other Ambulatory Visit: Payer: Self-pay

## 2018-09-25 ENCOUNTER — Encounter: Payer: Managed Care, Other (non HMO) | Admitting: Internal Medicine

## 2018-10-23 ENCOUNTER — Encounter: Payer: Managed Care, Other (non HMO) | Admitting: Internal Medicine

## 2019-02-03 ENCOUNTER — Encounter: Payer: Self-pay | Admitting: Internal Medicine

## 2019-06-19 ENCOUNTER — Telehealth: Payer: Self-pay | Admitting: *Deleted

## 2019-06-19 NOTE — Telephone Encounter (Signed)
Pt showed up at [facility] for INR, he had a letter from 2020 but [facility] did not have an order. Pt has not been seen in clinic since 08/2017 Gave the tech dr groce's ph# TO CHECK ON ORDER

## 2020-07-21 ENCOUNTER — Other Ambulatory Visit: Payer: Self-pay | Admitting: Pharmacist

## 2020-07-21 DIAGNOSIS — Z7901 Long term (current) use of anticoagulants: Secondary | ICD-10-CM

## 2020-07-21 DIAGNOSIS — D6859 Other primary thrombophilia: Secondary | ICD-10-CM

## 2020-07-21 DIAGNOSIS — R760 Raised antibody titer: Secondary | ICD-10-CM

## 2020-07-21 DIAGNOSIS — Z86718 Personal history of other venous thrombosis and embolism: Secondary | ICD-10-CM

## 2020-07-25 ENCOUNTER — Other Ambulatory Visit: Payer: Self-pay | Admitting: Pharmacist

## 2020-07-25 DIAGNOSIS — R760 Raised antibody titer: Secondary | ICD-10-CM

## 2020-07-25 DIAGNOSIS — Z86718 Personal history of other venous thrombosis and embolism: Secondary | ICD-10-CM

## 2020-07-25 DIAGNOSIS — Z7901 Long term (current) use of anticoagulants: Secondary | ICD-10-CM

## 2020-07-25 DIAGNOSIS — D6859 Other primary thrombophilia: Secondary | ICD-10-CM

## 2021-11-06 ENCOUNTER — Telehealth: Payer: Self-pay

## 2021-11-06 ENCOUNTER — Ambulatory Visit: Payer: Managed Care, Other (non HMO)

## 2021-11-06 NOTE — Telephone Encounter (Signed)
Patient called this morning to cancel his coumadin appointment.  Per Tyler Aas, patient needs an appointment with one of our physicians to re-establish care before he can start back with Chancy Milroy in the coumadin clinic. HIs last office with a doctor by our records was April 2019.

## 2021-11-13 ENCOUNTER — Encounter: Payer: Managed Care, Other (non HMO) | Admitting: Student

## 2021-11-13 ENCOUNTER — Ambulatory Visit: Payer: Managed Care, Other (non HMO)

## 2023-03-29 ENCOUNTER — Other Ambulatory Visit: Payer: Self-pay | Admitting: Pharmacist

## 2023-03-29 DIAGNOSIS — D6859 Other primary thrombophilia: Secondary | ICD-10-CM

## 2023-03-29 DIAGNOSIS — Z86711 Personal history of pulmonary embolism: Secondary | ICD-10-CM

## 2023-03-29 MED ORDER — WARFARIN SODIUM 5 MG PO TABS: 5.0000 mg | ORAL_TABLET | Freq: Every day | ORAL | 3 refills | Status: AC

## 2024-02-17 ENCOUNTER — Ambulatory Visit: Payer: Self-pay

## 2024-02-17 ENCOUNTER — Other Ambulatory Visit: Payer: Self-pay

## 2024-02-17 MED ORDER — PT/INR TEST VI STRP
ORAL_STRIP | 1 refills | Status: AC
  Filled 2024-02-17: qty 48, 30d supply, fill #0

## 2024-02-17 MED ORDER — PT/INR TEST VI STRP
ORAL_STRIP | 1 refills | Status: DC
  Filled 2024-02-17: qty 48, fill #0

## 2024-02-17 NOTE — Addendum Note (Signed)
 Addended by: Thedford Bunton B on: 02/17/2024 02:05 PM   Modules accepted: Orders

## 2024-02-18 ENCOUNTER — Other Ambulatory Visit: Payer: Self-pay

## 2024-02-20 ENCOUNTER — Other Ambulatory Visit: Payer: Self-pay

## 2024-02-24 ENCOUNTER — Other Ambulatory Visit: Payer: Self-pay

## 2024-03-17 ENCOUNTER — Other Ambulatory Visit: Payer: Self-pay

## 2024-04-07 ENCOUNTER — Other Ambulatory Visit: Payer: Self-pay

## 2024-04-10 ENCOUNTER — Other Ambulatory Visit: Payer: Self-pay

## 2024-07-03 ENCOUNTER — Encounter: Payer: Self-pay | Admitting: Vascular Surgery

## 2024-07-03 ENCOUNTER — Ambulatory Visit (HOSPITAL_COMMUNITY): Payer: Self-pay
